# Patient Record
Sex: Female | Born: 1965 | Hispanic: Yes | Marital: Single | Smoking: Never smoker
Health system: Southern US, Community
[De-identification: ages and names within clinical notes are randomized; demographics above are authoritative.]

## PROBLEM LIST (undated history)

## (undated) DIAGNOSIS — Z9221 Personal history of antineoplastic chemotherapy: Secondary | ICD-10-CM

## (undated) DIAGNOSIS — C539 Malignant neoplasm of cervix uteri, unspecified: Secondary | ICD-10-CM

## (undated) DIAGNOSIS — Z923 Personal history of irradiation: Secondary | ICD-10-CM

## (undated) HISTORY — DX: Malignant neoplasm of cervix uteri, unspecified: C53.9

## (undated) HISTORY — DX: Personal history of irradiation: Z92.3

## (undated) HISTORY — DX: Personal history of antineoplastic chemotherapy: Z92.21

---

## 2002-03-29 ENCOUNTER — Inpatient Hospital Stay (HOSPITAL_COMMUNITY): Admission: AD | Admit: 2002-03-29 | Discharge: 2002-03-31 | Payer: Self-pay | Admitting: *Deleted

## 2009-02-11 HISTORY — PX: BILATERAL SALPINGOOPHORECTOMY: SHX1223

## 2009-07-05 ENCOUNTER — Encounter: Admission: RE | Admit: 2009-07-05 | Discharge: 2009-07-05 | Payer: Self-pay | Admitting: Specialist

## 2009-07-27 ENCOUNTER — Ambulatory Visit: Payer: Self-pay | Admitting: Obstetrics & Gynecology

## 2009-07-27 ENCOUNTER — Other Ambulatory Visit: Admission: RE | Admit: 2009-07-27 | Discharge: 2009-07-27 | Payer: Self-pay | Admitting: Obstetrics and Gynecology

## 2009-07-28 ENCOUNTER — Encounter: Payer: Self-pay | Admitting: Obstetrics and Gynecology

## 2009-07-28 LAB — CONVERTED CEMR LAB
CA 125: 11.9 units/mL (ref 0.0–30.2)
HCT: 27.8 % — ABNORMAL LOW (ref 36.0–46.0)
MCHC: 30.2 g/dL (ref 30.0–36.0)
RBC: 3.61 M/uL — ABNORMAL LOW (ref 3.87–5.11)
RDW: 16 % — ABNORMAL HIGH (ref 11.5–15.5)
WBC: 5.8 10*3/uL (ref 4.0–10.5)

## 2009-08-16 ENCOUNTER — Ambulatory Visit: Payer: Self-pay | Admitting: Obstetrics & Gynecology

## 2009-08-17 ENCOUNTER — Encounter: Payer: Self-pay | Admitting: Obstetrics and Gynecology

## 2009-08-17 ENCOUNTER — Ambulatory Visit: Admission: RE | Admit: 2009-08-17 | Discharge: 2009-08-17 | Payer: Self-pay | Admitting: Gynecologic Oncology

## 2009-08-17 LAB — CONVERTED CEMR LAB: Ferritin: 2 ng/mL — ABNORMAL LOW (ref 10–291)

## 2009-09-22 ENCOUNTER — Ambulatory Visit: Admission: RE | Admit: 2009-09-22 | Discharge: 2009-11-14 | Payer: Self-pay | Admitting: Radiation Oncology

## 2009-10-03 ENCOUNTER — Ambulatory Visit: Payer: Self-pay | Admitting: Oncology

## 2009-10-05 LAB — COMPREHENSIVE METABOLIC PANEL
Alkaline Phosphatase: 50 U/L (ref 39–117)
BUN: 21 mg/dL (ref 6–23)
Calcium: 8.5 mg/dL (ref 8.4–10.5)
Chloride: 104 mEq/L (ref 96–112)
Creatinine, Ser: 0.67 mg/dL (ref 0.40–1.20)
Glucose, Bld: 99 mg/dL (ref 70–99)
Potassium: 4.4 mEq/L (ref 3.5–5.3)

## 2009-10-05 LAB — CBC WITH DIFFERENTIAL/PLATELET
BASO%: 0.2 % (ref 0.0–2.0)
Basophils Absolute: 0 10*3/uL (ref 0.0–0.1)
Eosinophils Absolute: 0.2 10*3/uL (ref 0.0–0.5)
LYMPH%: 33.2 % (ref 14.0–49.7)
MCHC: 31.6 g/dL (ref 31.5–36.0)
MCV: 77 fL — ABNORMAL LOW (ref 79.5–101.0)
MONO%: 7.4 % (ref 0.0–14.0)
RBC: 4.31 10*6/uL (ref 3.70–5.45)
WBC: 5.9 10*3/uL (ref 3.9–10.3)

## 2009-10-23 LAB — CBC WITH DIFFERENTIAL/PLATELET
BASO%: 0.4 % (ref 0.0–2.0)
LYMPH%: 18.8 % (ref 14.0–49.7)
MCHC: 32.3 g/dL (ref 31.5–36.0)
MCV: 78.4 fL — ABNORMAL LOW (ref 79.5–101.0)
MONO#: 0.3 10*3/uL (ref 0.1–0.9)
Platelets: 192 10*3/uL (ref 145–400)
RDW: 23 % — ABNORMAL HIGH (ref 11.2–14.5)
WBC: 2.6 10*3/uL — ABNORMAL LOW (ref 3.9–10.3)
nRBC: 0 % (ref 0–0)

## 2009-10-23 LAB — BASIC METABOLIC PANEL
BUN: 7 mg/dL (ref 6–23)
CO2: 28 mEq/L (ref 19–32)
Calcium: 8.8 mg/dL (ref 8.4–10.5)
Creatinine, Ser: 0.52 mg/dL (ref 0.40–1.20)
Glucose, Bld: 99 mg/dL (ref 70–99)
Potassium: 4.1 mEq/L (ref 3.5–5.3)

## 2009-10-23 LAB — MAGNESIUM: Magnesium: 2.2 mg/dL (ref 1.5–2.5)

## 2009-11-02 ENCOUNTER — Ambulatory Visit: Payer: Self-pay | Admitting: Oncology

## 2009-11-06 LAB — BASIC METABOLIC PANEL
BUN: 10 mg/dL (ref 6–23)
Glucose, Bld: 107 mg/dL — ABNORMAL HIGH (ref 70–99)
Sodium: 135 mEq/L (ref 135–145)

## 2009-11-06 LAB — CBC WITH DIFFERENTIAL/PLATELET
BASO%: 0.4 % (ref 0.0–2.0)
EOS%: 0.7 % (ref 0.0–7.0)
Eosinophils Absolute: 0 10*3/uL (ref 0.0–0.5)
HCT: 33.2 % — ABNORMAL LOW (ref 34.8–46.6)
MCHC: 32.8 g/dL (ref 31.5–36.0)
MONO%: 12.5 % (ref 0.0–14.0)
RBC: 4.2 10*6/uL (ref 3.70–5.45)
RDW: 22.8 % — ABNORMAL HIGH (ref 11.2–14.5)
WBC: 2.7 10*3/uL — ABNORMAL LOW (ref 3.9–10.3)

## 2009-11-14 LAB — CBC WITH DIFFERENTIAL/PLATELET
BASO%: 0 % (ref 0.0–2.0)
Basophils Absolute: 0 10*3/uL (ref 0.0–0.1)
EOS%: 0.4 % (ref 0.0–7.0)
LYMPH%: 10.7 % — ABNORMAL LOW (ref 14.0–49.7)
MCHC: 34.1 g/dL (ref 31.5–36.0)
MONO%: 12.9 % (ref 0.0–14.0)
NEUT%: 76 % (ref 38.4–76.8)
RBC: 4.13 10*6/uL (ref 3.70–5.45)
RDW: 22.8 % — ABNORMAL HIGH (ref 11.2–14.5)
lymph#: 0.2 10*3/uL — ABNORMAL LOW (ref 0.9–3.3)
nRBC: 0 % (ref 0–0)

## 2009-11-15 DIAGNOSIS — Z9221 Personal history of antineoplastic chemotherapy: Secondary | ICD-10-CM

## 2009-11-15 HISTORY — DX: Personal history of antineoplastic chemotherapy: Z92.21

## 2009-11-16 ENCOUNTER — Ambulatory Visit (HOSPITAL_COMMUNITY): Admission: RE | Admit: 2009-11-16 | Discharge: 2009-11-16 | Payer: Self-pay | Admitting: Radiation Oncology

## 2009-11-21 ENCOUNTER — Ambulatory Visit: Admission: RE | Admit: 2009-11-21 | Discharge: 2009-11-21 | Payer: Self-pay | Admitting: Radiation Oncology

## 2009-11-21 LAB — CBC WITH DIFFERENTIAL/PLATELET
Basophils Absolute: 0 10*3/uL (ref 0.0–0.1)
EOS%: 1.3 % (ref 0.0–7.0)
HCT: 28.6 % — ABNORMAL LOW (ref 34.8–46.6)
LYMPH%: 25.7 % (ref 14.0–49.7)
MCH: 27 pg (ref 25.1–34.0)
MCV: 79.7 fL (ref 79.5–101.0)
MONO#: 0.2 10*3/uL (ref 0.1–0.9)
NEUT#: 0.9 10*3/uL — ABNORMAL LOW (ref 1.5–6.5)
NEUT%: 59.2 % (ref 38.4–76.8)
Platelets: 136 10*3/uL — ABNORMAL LOW (ref 145–400)

## 2009-11-22 ENCOUNTER — Ambulatory Visit: Admission: RE | Admit: 2009-11-22 | Discharge: 2009-11-22 | Payer: Self-pay | Admitting: Radiation Oncology

## 2009-11-24 ENCOUNTER — Ambulatory Visit: Admission: RE | Admit: 2009-11-24 | Discharge: 2009-11-24 | Payer: Self-pay | Admitting: Radiation Oncology

## 2009-11-24 LAB — CBC WITH DIFFERENTIAL/PLATELET
BASO%: 0 % (ref 0.0–2.0)
Basophils Absolute: 0 10*3/uL (ref 0.0–0.1)
HCT: 30.6 % — ABNORMAL LOW (ref 34.8–46.6)
LYMPH%: 34 % (ref 14.0–49.7)
MCHC: 33.3 g/dL (ref 31.5–36.0)
MCV: 81 fL (ref 79.5–101.0)
MONO%: 13.6 % (ref 0.0–14.0)
NEUT%: 51 % (ref 38.4–76.8)
Platelets: 174 10*3/uL (ref 145–400)
RDW: 23.4 % — ABNORMAL HIGH (ref 11.2–14.5)
WBC: 1.5 10*3/uL — ABNORMAL LOW (ref 3.9–10.3)

## 2009-11-28 ENCOUNTER — Ambulatory Visit: Admission: RE | Admit: 2009-11-28 | Discharge: 2009-11-28 | Payer: Self-pay | Admitting: Radiation Oncology

## 2009-11-28 LAB — CBC WITH DIFFERENTIAL/PLATELET
BASO%: 0.5 % (ref 0.0–2.0)
Basophils Absolute: 0 10*3/uL (ref 0.0–0.1)
HCT: 29.9 % — ABNORMAL LOW (ref 34.8–46.6)
MCHC: 33.4 g/dL (ref 31.5–36.0)
MCV: 81.3 fL (ref 79.5–101.0)
MONO#: 0.3 10*3/uL (ref 0.1–0.9)
MONO%: 15 % — ABNORMAL HIGH (ref 0.0–14.0)
NEUT#: 1 10*3/uL — ABNORMAL LOW (ref 1.5–6.5)
NEUT%: 53.9 % (ref 38.4–76.8)
Platelets: 249 10*3/uL (ref 145–400)
WBC: 1.9 10*3/uL — ABNORMAL LOW (ref 3.9–10.3)
lymph#: 0.6 10*3/uL — ABNORMAL LOW (ref 0.9–3.3)

## 2009-12-06 ENCOUNTER — Ambulatory Visit: Payer: Self-pay | Admitting: Oncology

## 2009-12-08 LAB — BASIC METABOLIC PANEL
Creatinine, Ser: 0.57 mg/dL (ref 0.40–1.20)
Potassium: 4.2 mEq/L (ref 3.5–5.3)
Sodium: 140 mEq/L (ref 135–145)

## 2009-12-08 LAB — CBC WITH DIFFERENTIAL/PLATELET
Basophils Absolute: 0 10*3/uL (ref 0.0–0.1)
Eosinophils Absolute: 0 10*3/uL (ref 0.0–0.5)
LYMPH%: 14.8 % (ref 14.0–49.7)
MCH: 29.6 pg (ref 25.1–34.0)
MCV: 85 fL (ref 79.5–101.0)
MONO%: 13.8 % (ref 0.0–14.0)
Platelets: 282 10*3/uL (ref 145–400)
RBC: 3.39 10*6/uL — ABNORMAL LOW (ref 3.70–5.45)

## 2009-12-14 ENCOUNTER — Inpatient Hospital Stay (HOSPITAL_COMMUNITY): Admission: RE | Admit: 2009-12-14 | Discharge: 2009-12-17 | Payer: Self-pay | Admitting: Radiation Oncology

## 2010-01-12 ENCOUNTER — Ambulatory Visit (HOSPITAL_COMMUNITY)
Admission: RE | Admit: 2010-01-12 | Discharge: 2010-01-12 | Payer: Self-pay | Source: Home / Self Care | Admitting: Oncology

## 2010-02-22 ENCOUNTER — Ambulatory Visit
Admission: RE | Admit: 2010-02-22 | Discharge: 2010-02-22 | Payer: Self-pay | Source: Home / Self Care | Attending: Radiation Oncology | Admitting: Radiation Oncology

## 2010-03-03 ENCOUNTER — Encounter: Payer: Self-pay | Admitting: Radiation Oncology

## 2010-03-04 ENCOUNTER — Encounter: Payer: Self-pay | Admitting: Obstetrics and Gynecology

## 2010-03-28 ENCOUNTER — Ambulatory Visit: Payer: Self-pay | Attending: Gynecologic Oncology | Admitting: Gynecologic Oncology

## 2010-03-28 DIAGNOSIS — Z9079 Acquired absence of other genital organ(s): Secondary | ICD-10-CM | POA: Insufficient documentation

## 2010-03-28 DIAGNOSIS — C539 Malignant neoplasm of cervix uteri, unspecified: Secondary | ICD-10-CM | POA: Insufficient documentation

## 2010-04-04 NOTE — Discharge Summary (Signed)
[name], [name] NO.:  [id number]  MEDICAL RECORD NO.:  [id number]          PATIENT TYPE:  WOC  LOCATION:  WOC                          FACILITY:  [facility]  PHYSICIAN:  [name], M.D.  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  REASON FOR ADMISSION:  Radioactive cesium-137 radiation implant.  CONDITION OF PATIENT ON DISCHARGE:  Stable.  FOLLOWUP APPOINTMENT:  Radiation Oncology in 1 month.  The patient needs to call if she should experience severe vaginal bleeding or a temperature of 101.5 or greater.  Telephone number for Radiation Oncology is [phone].  [name] is a very pleasant 45 year old female who earlier this year was diagnosed with advanced cervical cancer.  She underwent initial surgery at [facility].  Given the fact the patient had positive lymph nodes, she did not have this hysterectomy.  The patient subsequently was transferred to [facility] for definitive treatment. The patient was initially treated with external beam radiation therapy encompassing the pelvis and low periaortic area.  She completed 4500 cGy.  The patient also received radiosensitizing chemotherapy during the course of her external beam radiation treatments.  On December 14, 2009, the patient was admitted to the hospital and taken to the operating room.  On exam under anesthesia, the patient was noted to have shrinkage of her cervical mass.  There was no obvious parametrial extension.  The patient was prepped and draped in the usual sterile fashion and placed in the dorsal lithotomy position.  She had gold fiducial markers placed along the cervix for radiation planning.  The patient subsequently had a minimum curvature Fletcher-Suit applicator placed under ultrasound guidance.  She also had  fluoroscopy at the completion of the procedure in the operating room documenting good position of the Fletcher-Suit applicator prior to awakening.  The patient was subsequently transferred to the radiation oncology department for planning purposes.  She underwent a CT scan on the tomotherapy unit, which is a megavoltage CT scanner.  This allowed imaging of the Fletcher-Suit applicator as well as the patient's bladder and rectum and other structures within the pelvis.  After extensive planning, the patient was subsequently loaded with 25, 10, 15 mg sources in the tandem.  The left ovoid was loaded with a 10 mg source and the right ovoid was loaded with a 15 mg cesium radiation source.  The start time for the patient's implant was 9:20 p.m. on Thursday, November 3rd.  Implant duration was 63.2 hours.  Total dose to point A was 8100 cGy in summing the patient's implant dose and external beam radiation dose.  On the morning of November 6, the patient subsequently had removal of her radiation sources at 11:32 a.m.  Please note that the daylight savings time change change made adjustment in the actual time removal.  Originally, the implant was scheduled for removal at 12:32 p.m.  The patient's radiation sources were subsequently removed without difficulties.  A radiation survey by Susie Cassette, CMD, showed no retained sources  in the patient's bed or room.  The patient's Fletcher-Suit applicator was subsequently removed without difficulty.  There was minimal bleeding noted.  The patient was subsequently discharged to home and will follow up Radiation Oncology in approximately a month.  This does complete the patient's planned course of definitive treatments.          ______________________________ [name], M.D.     JDK/MEDQ  D:  12/17/2009  T:  12/17/2009  Job:  [id number]  cc:   [name], MD [address] [place] [address]  Artelia Laroche. Karren Burly, MD  Electronically Signed  by Antony Blackbird M.D. on 04/04/2010 46:96:29 PM

## 2010-04-04 NOTE — Op Note (Signed)
[name], [name]           ACCOUNT NO.:  [id number]  MEDICAL RECORD NO.:  [id number]           PATIENT TYPE:  LOCATION:                                 FACILITY:  PHYSICIAN:  [name], M.D.  DATE OF BIRTH:  14-Sep-1965  DATE OF PROCEDURE: DATE OF DISCHARGE:                              OPERATIVE REPORT   PREOPERATIVE DIAGNOSIS:  Locally advanced squamous cell carcinoma of cervix.  POSTOPERATIVE DIAGNOSIS:  Locally advanced squamous cell carcinoma of cervix.  PROCEDURES: 1. Exam under anesthesia. 2. Placement of a gold fiducial markers in preparation for radiation     planning and treatment. 3. Placement of a Fletcher-Suit applicator in preparation for the     cesium-137 radioactive implant.  SURGEON:  [name], MD.  ASSISTANT:  None.  ANESTHESIA:  General LMA.  SPECIMENS:  None.  ESTIMATED BLOOD LOSS:  Zero.  COMPLICATIONS:  None.  FINDINGS:  The patient's uterus was mildly inverted and sounded to approximately 8-9 cm.  The cervix was estimated to be approximately 3-4 cm in size without obvious parametrial extension on exam under anesthesia.  Diane King is a 45 year old female who was diagnosed earlier this year with advanced cervical cancer.  She was taken to the operating room at [facility] and was scheduled for radical hysterectomy.  At the time of the patient's exploratory laparotomy, she was found to have positive nodes.  In light of this, the patient's radical hysterectomy was aborted.  The patient was subsequently transferred to [facility] for the remainder of her care.  The patient previously underwent external beam radiation therapy, which she completed approximately 2 weeks ago.  The patient is now taken to the operating room for her intracavitary brachytherapy.  NARRATIVE:  The patient was prepped and draped in the usual sterile fashion and placed in the dorsal lithotomy position.  Under exam under anesthesia, the patient  was noted to have a cervical mass, which was estimated to be approximately 3-4 cm in size.  There was no obvious parametrial extension.  The patient then had placement of a Foley catheter under sterile conditions.  The Foley balloon was placed with approximately 50% contrast for imaging purposes.  The patient then proceeded to have 3 fiducial markers placed within the cervix.  These were placed approximately the 10 o'clock, 2 o'clock, and 6 o'clock position of the cervix.  The patient then proceeded to undergo sounding of the uterus, followed by dilatation.  The uterus was estimated to be approximately 8-9 cm in length and was mildly anteverted.  The patient was dilated to sounding of 24.  The patient then had placement of a moderate curvature tandem within the uterus.  This was performed with intraoperative ultrasound guidance showing good placement of the tandem near the fundus and well positioned centrally within the uterus.  The patient then had 2 ovoids placed within the proximal vagina.  Minimal shielding caps were placed over the ovoids.  The patient then had the vaginal vault packed with sterile gauze soaked in the metronidazole vaginal cream.  Special care was taken to move the rectum and bladder as much out of the high-dose  radiation field as possible.  After completion of packing, the patient then had placement of a suture in the labia majora to help prevent slippage of the Fletcher-Suit applicator.  The patient tolerated the procedure well.  The patient then had fluoroscopy of her cesium implant intraoperatively in the supine position.  AP and lateral films were obtained, which would be used in implanting her cesium implant along with megavoltage CT scanning on the Tomotherapy unit.  The patient was subsequently transported to the recovery room in stable condition.  After appropriate recuperation, the patient will be taken to radiation-oncology department for the planning.  She  will eventually have loading of her cesium-137 radiation sources this evening with anticipated implant of approximately 60 hours.          ______________________________ [name], M.D.     JDK/MEDQ  D:  12/14/2009  T:  12/15/2009  Job:  [id number]  cc:   [name], MD [address] [place] [address]  Lennis P. Darrold Span, M.D. Fax: [phone]  Electronically Signed by Antony Blackbird M.D. on 04/04/2010 04:39:49 PM

## 2010-04-25 LAB — DIFFERENTIAL
Basophils Absolute: 0 10*3/uL (ref 0.0–0.1)
Eosinophils Absolute: 0 10*3/uL (ref 0.0–0.7)
Monocytes Absolute: 0.3 10*3/uL (ref 0.1–1.0)
Monocytes Relative: 11 % (ref 3–12)
Neutro Abs: 1.8 10*3/uL (ref 1.7–7.7)

## 2010-04-25 LAB — BASIC METABOLIC PANEL
BUN: 9 mg/dL (ref 6–23)
Calcium: 9 mg/dL (ref 8.4–10.5)
GFR calc Af Amer: >60 mL/min (ref >60)
Glucose, Bld: 113 mg/dL — ABNORMAL HIGH (ref 70–99)
Sodium: 140 mEq/L (ref 135–145)

## 2010-04-25 LAB — URINALYSIS, ROUTINE W REFLEX MICROSCOPIC
Glucose, UA: NEGATIVE mg/dL
Hgb urine dipstick: NEGATIVE
Ketones, ur: NEGATIVE mg/dL
Nitrite: NEGATIVE
Specific Gravity, Urine: 1.022 (ref 1.005–1.030)
Urobilinogen, UA: 0.2 mg/dL (ref 0.0–1.0)
pH: 6.5 (ref 5.0–8.0)

## 2010-04-25 LAB — CBC
Hemoglobin: 10.4 g/dL — ABNORMAL LOW (ref 12.0–15.0)
MCV: 85 fL (ref 78.0–100.0)
RDW: 25.9 % — ABNORMAL HIGH (ref 11.5–15.5)
WBC: 2.4 10*3/uL — ABNORMAL LOW (ref 4.0–10.5)

## 2010-04-25 LAB — URINE MICROSCOPIC-ADD ON

## 2010-04-25 LAB — URINE CULTURE: Colony Count: NO GROWTH

## 2010-04-25 LAB — SURGICAL PCR SCREEN: Staphylococcus aureus: NEGATIVE

## 2010-04-26 LAB — DIFFERENTIAL
Basophils Absolute: 0 10*3/uL (ref 0.0–0.1)
Basophils Relative: 0 % (ref 0–1)
Eosinophils Relative: 1 % (ref 0–5)
Monocytes Relative: 8 % (ref 3–12)
Neutro Abs: 2.1 10*3/uL (ref 1.7–7.7)

## 2010-04-26 LAB — URINE CULTURE
Culture  Setup Time: 201110061723
Culture: NO GROWTH
Special Requests: NEGATIVE

## 2010-04-26 LAB — URINALYSIS, ROUTINE W REFLEX MICROSCOPIC
Bilirubin Urine: NEGATIVE
Hgb urine dipstick: NEGATIVE
Ketones, ur: NEGATIVE mg/dL
Nitrite: NEGATIVE
Specific Gravity, Urine: 1.024 (ref 1.005–1.030)
Urobilinogen, UA: 0.2 mg/dL (ref 0.0–1.0)
pH: 6.5 (ref 5.0–8.0)

## 2010-04-26 LAB — COMPREHENSIVE METABOLIC PANEL
AST: 15 U/L (ref 0–37)
Albumin: 3.8 g/dL (ref 3.5–5.2)
CO2: 27 mEq/L (ref 19–32)
Chloride: 106 mEq/L (ref 96–112)
Creatinine, Ser: 0.48 mg/dL (ref 0.4–1.2)
Glucose, Bld: 100 mg/dL — ABNORMAL HIGH (ref 70–99)
Potassium: 3.7 mEq/L (ref 3.5–5.1)
Sodium: 139 mEq/L (ref 135–145)

## 2010-04-26 LAB — CBC
HCT: 32.5 % — ABNORMAL LOW (ref 36.0–46.0)
Platelets: 173 10*3/uL (ref 150–400)

## 2010-04-26 LAB — PREGNANCY, URINE: Preg Test, Ur: NEGATIVE

## 2010-04-26 LAB — PROTIME-INR
INR: 0.93 (ref 0.00–1.49)
Prothrombin Time: 12.7 seconds (ref 11.6–15.2)

## 2010-05-04 NOTE — Consult Note (Signed)
  [name], [name] NO.:  [id number]  MEDICAL RECORD NO.:  [id number]           PATIENT TYPE:  LOCATION:                                 FACILITY:  PHYSICIAN:  [name], MD     DATE OF BIRTH:  September 03, 1965  DATE OF CONSULTATION:  03/28/2010 DATE OF DISCHARGE:                                CONSULTATION   HISTORY OF PRESENT ILLNESS:  This is a 45 year old who presented with a stage IB squamous cell carcinoma of the cervix.  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  Bilateral ovarian transposition was performed as well as bilateral pelvic lymph node dissection.  Pathology was notable for presence of metastatic disease within a pelvic lymph node. She subsequently underwent definitive radiotherapy with concurrent chemotherapy.  Treatment was completed in November 2011. Postoperatively during radiation, abnormal uterine bleeding was noted, however, this has since stopped.  PAST MEDICAL HISTORY:  Denies.  PAST SURGICAL HISTORY:  Right salpingo-oophorectomy, left ovarian transposition, bilateral pelvic lymph node dissection in 2011.  SOCIAL HISTORY:  Denies tobacco or alcohol use.  She is supported by her children and is of [place] origin.  REVIEW OF SYSTEMS:  10-point review of systems, weight gain.  No nausea, no vomiting.  No abdominal pain.  No lower extremity edema.  PHYSICAL EXAMINATION:  GENERAL:  Well-developed female in no acute distress. ABDOMEN:  Soft, nontender.  Midline incision well healed without any hernia. BACK:  No CVA tenderness. LYMPH NODES:  No cervical, subclavicular, or inguinal adenopathy. PELVIC:  Normal external genitalia, Bartholin, urethral, and Skene.  No evidence of residual disease within the cervix.  No parametrial nodularity.  Normal uterine mobility. RECTAL:  Good anal sphincter  tone without any masses.  IMPRESSION:  [name] without evidence of disease following definitive treatment of her stage IB cervical cancer with 1 positive pelvic node.  She will follow up with Dr. Roselind Messier in 3 months and follow up with GYN/Oncology in 6 months.     [name], MD     WB/MEDQ  D:  03/28/2010  T:  03/29/2010  Job:  [id number]  cc:   Allie Bossier, MD  [name], M.D. Fax: [phone]  Telford Nab, RN  Electronically Signed by [name] MD on 03/29/2010 01:13:37 PM

## 2010-06-21 ENCOUNTER — Other Ambulatory Visit: Payer: Self-pay | Admitting: Oncology

## 2010-06-21 ENCOUNTER — Encounter (HOSPITAL_BASED_OUTPATIENT_CLINIC_OR_DEPARTMENT_OTHER): Payer: Self-pay | Admitting: Oncology

## 2010-06-21 DIAGNOSIS — Z5111 Encounter for antineoplastic chemotherapy: Secondary | ICD-10-CM

## 2010-06-21 DIAGNOSIS — D649 Anemia, unspecified: Secondary | ICD-10-CM

## 2010-06-21 DIAGNOSIS — C539 Malignant neoplasm of cervix uteri, unspecified: Secondary | ICD-10-CM

## 2010-06-21 LAB — COMPREHENSIVE METABOLIC PANEL
ALT: 15 U/L (ref 0–35)
AST: 15 U/L (ref 0–37)
Albumin: 4.3 g/dL (ref 3.5–5.2)
Alkaline Phosphatase: 68 U/L (ref 39–117)
Chloride: 104 mEq/L (ref 96–112)
Potassium: 3.8 mEq/L (ref 3.5–5.3)
Sodium: 139 mEq/L (ref 135–145)
Total Protein: 6.8 g/dL (ref 6.0–8.3)

## 2010-06-21 LAB — CBC WITH DIFFERENTIAL/PLATELET
BASO%: 0.2 % (ref 0.0–2.0)
EOS%: 1 % (ref 0.0–7.0)
MCH: 29.5 pg (ref 25.1–34.0)
MCV: 88.6 fL (ref 79.5–101.0)
MONO%: 7.8 % (ref 0.0–14.0)
RBC: 3.89 10*6/uL (ref 3.70–5.45)
RDW: 15.8 % — ABNORMAL HIGH (ref 11.2–14.5)
lymph#: 0.6 10*3/uL — ABNORMAL LOW (ref 0.9–3.3)

## 2010-06-26 ENCOUNTER — Ambulatory Visit
Admission: RE | Admit: 2010-06-26 | Discharge: 2010-06-26 | Disposition: A | Discharge status: Home / Self Care | Payer: Self-pay | Source: Ambulatory Visit | Attending: Radiation Oncology | Admitting: Radiation Oncology

## 2010-06-26 ENCOUNTER — Other Ambulatory Visit (HOSPITAL_COMMUNITY)
Admission: RE | Admit: 2010-06-26 | Discharge: 2010-06-26 | Disposition: A | Discharge status: Home / Self Care | Payer: Self-pay | Source: Ambulatory Visit | Attending: Radiation Oncology | Admitting: Radiation Oncology

## 2010-06-26 ENCOUNTER — Other Ambulatory Visit: Payer: Self-pay | Admitting: Radiation Oncology

## 2010-06-26 DIAGNOSIS — C539 Malignant neoplasm of cervix uteri, unspecified: Secondary | ICD-10-CM | POA: Insufficient documentation

## 2010-09-27 ENCOUNTER — Ambulatory Visit: Payer: Self-pay | Attending: Gynecologic Oncology | Admitting: Gynecologic Oncology

## 2010-09-27 DIAGNOSIS — C539 Malignant neoplasm of cervix uteri, unspecified: Secondary | ICD-10-CM | POA: Insufficient documentation

## 2010-09-27 DIAGNOSIS — C775 Secondary and unspecified malignant neoplasm of intrapelvic lymph nodes: Secondary | ICD-10-CM | POA: Insufficient documentation

## 2010-09-27 DIAGNOSIS — Z9079 Acquired absence of other genital organ(s): Secondary | ICD-10-CM | POA: Insufficient documentation

## 2010-10-02 NOTE — Consult Note (Signed)
  NAMEDASHANA, Diane King NO.:  [id number]  MEDICAL RECORD NO.:  [id number]  LOCATION:  GYN                          FACILITY:  [facility]  PHYSICIAN:  [name], MD     DATE OF BIRTH:  Mar 31, 1965  DATE OF CONSULTATION:  09/27/2010 DATE OF DISCHARGE:                                CONSULTATION   REASON FOR VISIT:  Surveillance for stage IB cervical cancer.  HISTORY OF PRESENT ILLNESS:  This is a 45 year old who presented with evidence of a stage IB cervical carcinoma.  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 Metastatic disease was identified within the pelvic node as such she underwent chemoradiation therapy which was completed in October 2011.  PAST MEDICAL HISTORY:  No interval changes.  PAST SURGICAL HISTORY:  Bilateral salpingo-oophorectomy and bilateral pelvic lymph node dissection in 2011.  SOCIAL HISTORY:  Denies tobacco or IV drug abuse.  She is not employed. She migrated from [place] numerous years ago.  She reports that she is intermittently sexually active.  REVIEW OF SYSTEMS:  No nausea, vomiting, fever, chills, headache, shortness of breath, or hemoptysis.  No weight loss, early satiety, or changes in appetite.  No diarrhea, constipation, bleeding from the rectum, bladder, or vagina.  PHYSICAL EXAMINATION:  GENERAL:  A well-developed female, in no acute distress. VITAL SIGNS:  Weight 163 pounds, blood pressure 120/64, and pulse 72. CHEST:  Clear to auscultation. LYMPH NODES:  No cervical, supraclavicular, or inguinal adenopathy. HEART:  Regular rate and rhythm. BACK:  No CVA tenderness. ABDOMEN:  Soft and nontender. PELVIC:  Normal external genitalia, Bartholin's, urethral, and Skene's. Vagina is atrophic.  Cervix is without any lesion.  No nodularity noted in the parametrial areas.  Uterus is mobile.  No nodularity in  the cul- de-sac. RECTAL:  Good anal sphincter tone without any rectovaginal septal nodularity. EXTREMITIES:  No clubbing, cyanosis, or edema.  IMPRESSION:  Diane King is NED for 10 months since completion of definitive chemoradiation.  Pap test collected in May 2012 by Dr. Roselind Messier is without any abnormalities.  I have asked her to follow up with Dr. Roselind Messier  in November 2012 and follow up with GYN/Oncology in February 2013.     [name], MD     WB/MEDQ  D:  09/27/2010  T:  09/28/2010  Job:  [id number]  cc:   Reece Packer, M.D. Fax: [phone]  [name], Ph.D., M.D. Fax: [phone]  Telford Nab, R.N. [address] [place], [place] [address]  Electronically Signed by [name] MD on 10/02/2010 07:09:34 AM

## 2010-12-21 ENCOUNTER — Other Ambulatory Visit: Payer: Self-pay | Admitting: Lab

## 2010-12-21 ENCOUNTER — Ambulatory Visit: Payer: Self-pay | Admitting: Oncology

## 2010-12-23 ENCOUNTER — Telehealth: Payer: Self-pay | Admitting: Oncology

## 2010-12-23 NOTE — Telephone Encounter (Signed)
FTKA visit Dr.Livesay Nov.9, 2012. Per Dr.Brewster's consult note of Sep 27, 2010, follow up was to be with Dr.Kinard in Nov, 2012 and back to Dr. Nelly Rout in Feb. 2013. I will not try to reschedule my appt, but will be glad to see her back at any time if the other physicians request.

## 2010-12-31 ENCOUNTER — Ambulatory Visit: Payer: Self-pay | Admitting: Radiation Oncology

## 2011-01-01 ENCOUNTER — Encounter: Payer: Self-pay | Admitting: *Deleted

## 2011-01-01 ENCOUNTER — Telehealth: Payer: Self-pay | Admitting: Oncology

## 2011-01-01 ENCOUNTER — Other Ambulatory Visit (HOSPITAL_COMMUNITY)
Admission: RE | Admit: 2011-01-01 | Discharge: 2011-01-01 | Disposition: A | Discharge status: Home / Self Care | Payer: Self-pay | Source: Ambulatory Visit | Attending: Radiation Oncology | Admitting: Radiation Oncology

## 2011-01-01 ENCOUNTER — Ambulatory Visit
Admission: RE | Admit: 2011-01-01 | Discharge: 2011-01-01 | Disposition: A | Discharge status: Home / Self Care | Payer: Self-pay | Source: Ambulatory Visit | Attending: Radiation Oncology | Admitting: Radiation Oncology

## 2011-01-01 VITALS — BP 114/72 | HR 93 | Temp 97.1°F | Resp 20 | Wt 155.7 lb

## 2011-01-01 DIAGNOSIS — C539 Malignant neoplasm of cervix uteri, unspecified: Secondary | ICD-10-CM

## 2011-01-01 DIAGNOSIS — Z923 Personal history of irradiation: Secondary | ICD-10-CM

## 2011-01-01 DIAGNOSIS — R87810 Cervical high risk human papillomavirus (HPV) DNA test positive: Secondary | ICD-10-CM | POA: Insufficient documentation

## 2011-01-01 DIAGNOSIS — Z01419 Encounter for gynecological examination (general) (routine) without abnormal findings: Secondary | ICD-10-CM | POA: Insufficient documentation

## 2011-01-01 NOTE — Progress Notes (Signed)
Follow up cervical ca, radiation therapy external beam 10/10/09-11/14/09 and cesium -137 intracavity tx 12/14/09-12/17/09   NKDA  WIDOWED

## 2011-01-01 NOTE — Progress Notes (Signed)
Encounter addended by: [name], MD on: 01/01/2011  4:18 PM<BR>     Documentation filed: Orders

## 2011-01-01 NOTE — Progress Notes (Signed)
Pap smear collected 1330pmn,pt tolerated well, will send specimen to the lab, 6 month f/u  appt given to interpreter dorita arias to show pt where to get appt made,  1:42 PM

## 2011-01-01 NOTE — Progress Notes (Signed)
Pt here for f/u pap to be done today, interpreter Darletta Moll with pt, pt is spanish speaking, no c/opain or discomfort,no bleeding, doesn't take any medications stated .explained that pap smear will be collected by Dr. Roselind Messier and results should be back within 1 week,pt will be called with that,interpreter Darletta Moll  States to call the interpreter service to explain results, ok by pt to do this  Pt had radiation therapy cervical cancer 10/10/09-11/14/09, cesium intracavity  tx 12/14/09-12/17/09 1:19 PM

## 2011-01-01 NOTE — Telephone Encounter (Signed)
Patient FTKA visit with Dr.Livesay on Dec 21, 2010. Per Dr.Brewster's consult note of Aug 2012, f/u is planned with Dr.Kinard in ~ Nov and Dr.Brewster in Feb. With close follow up by other MDs, I will not try to reschedule the FTKA visit here. This information sent to gyn onc and Dr.Kinard via email now. Certainly I am glad to see her back at any time if needed.

## 2011-01-02 NOTE — Progress Notes (Signed)
CC:   [name], MD [name], MD [name], MD  DIAGNOSIS:  Locally advanced cervical cancer.  INTERVAL SINCE RADIATION THERAPY:  One year.  NARRATIVE:  Diane King comes in today for routine followup.  She completed external beam and intracavitary cesium 137 treatment approximately a year ago.  Clinically the patient seems to be doing well.  She has been inconsistent with using her vaginal dilator, and I have recommended the importance of this issue.  The patient is accompanied by an interpreter today.  Through the interpreter, the patient denies any pain in the pelvis area, vaginal bleeding, blood in her urine, or rectal bleeding.  The patient denies any abdominal pain or nausea.  The patient's appetite is good.  PHYSICAL EXAMINATION:  Vital Signs:  The patient's weight is 155. Temperature is 97.1, pulse 93, blood pressure 114/72.  Neck: Examination of the neck and supraclavicular region reveals no evidence of adenopathy.  The axillary areas are free of adenopathy. Lungs: Examination of the lungs reveals them to be clear.  Heart:  The heart has a regular rhythm and rate.  Abdomen:  Soft and nontender with normal bowel sounds.  There is no inguinal adenopathy appreciated.  Pelvic:  On pelvic examination, the external genitalia are unremarkable.  A speculum exam was performed.  There are no mucosal lesions noted.  A Pap smear is obtained at what appears to be the cervical os.  On bimanual and rectovaginal examination there are no pelvic masses appreciated.  IMPRESSION AND PLAN:  Clinically NED, Pap smear pending.  The patient will return for routine followup in 6 months in the interim will see Dr. Nelly Rout.    ______________________________ [name], Ph.D., M.D. JDK/MEDQ  D:  01/01/2011  T:  01/01/2011  Job:  [id number]

## 2011-01-07 ENCOUNTER — Telehealth: Payer: Self-pay | Admitting: Oncology

## 2011-01-07 NOTE — Telephone Encounter (Signed)
Approved 100% ind 01/07/11 - 07/07/10. Family 1, no income, received letter of support.

## 2011-01-17 ENCOUNTER — Other Ambulatory Visit: Payer: Self-pay | Admitting: Gynecologic Oncology

## 2011-01-17 DIAGNOSIS — Z1231 Encounter for screening mammogram for malignant neoplasm of breast: Secondary | ICD-10-CM

## 2011-01-22 ENCOUNTER — Telehealth: Payer: Self-pay | Admitting: *Deleted

## 2011-01-22 NOTE — Telephone Encounter (Signed)
Called Diane King,interpreter phone [phone], asking for call back need of interpreter 2:31 PM to call results of pt's pap smear,awaiting call back

## 2011-01-24 ENCOUNTER — Telehealth: Payer: Self-pay | Admitting: *Deleted

## 2011-01-24 NOTE — Telephone Encounter (Signed)
Called pt home, unable to leave voice message,haven't heard back from interpreter will try pts cell phone and recall interperter for help to leave results of pts pap 11:25 AM

## 2011-01-24 NOTE — Telephone Encounter (Signed)
[name] SOWELL,INTERPRETER CALLED AND EXPLAINED i HAD TRIED TO CALL PT HOME PHONE AND CELL PHONE, UNABLE TO GET MESSAGE, [name], STATED:I KNOW HOW TO GET A HOLD OF HER, WILL INFOM HER OB ABNORMAL CELLS AND LAB TO DO MORE TESTING AND WILL GET BACK IN TOUCH WITH HER,[name] WILL CALL us BACK AFTER SHE SPEAKS WITH THE PATIENT 2:56 PM

## 2011-01-24 NOTE — Telephone Encounter (Signed)
Called pt cell phone,unable to leave message either 11:26 AM

## 2011-01-24 NOTE — Telephone Encounter (Signed)
Diane King,interpreter called and gave information results to pt (,[phone]=[name]'s number), she will let pt know if anything comes up different from further testing and  For [place] to get in touch with [name],herself on her phone, thanked Diane Fanning for calling back so soon, will call her if anything different comes up , will let Dr,.Kinard know as well 3:39 PM

## 2011-02-22 ENCOUNTER — Ambulatory Visit (HOSPITAL_COMMUNITY)
Admission: RE | Admit: 2011-02-22 | Discharge: 2011-02-22 | Disposition: A | Discharge status: Home / Self Care | Payer: Self-pay | Source: Ambulatory Visit | Attending: Gynecologic Oncology | Admitting: Gynecologic Oncology

## 2011-02-22 DIAGNOSIS — Z1231 Encounter for screening mammogram for malignant neoplasm of breast: Secondary | ICD-10-CM | POA: Insufficient documentation

## 2011-04-04 ENCOUNTER — Ambulatory Visit: Payer: Self-pay | Admitting: Gynecologic Oncology

## 2011-04-10 ENCOUNTER — Encounter: Payer: Self-pay | Admitting: *Deleted

## 2011-04-11 ENCOUNTER — Encounter: Payer: Self-pay | Admitting: Gynecologic Oncology

## 2011-04-11 ENCOUNTER — Ambulatory Visit: Payer: Self-pay | Attending: Gynecologic Oncology | Admitting: Gynecologic Oncology

## 2011-04-11 VITALS — BP 102/68 | HR 68 | Temp 97.5°F | Resp 16 | Ht 63.07 in | Wt 156.4 lb

## 2011-04-11 DIAGNOSIS — Z923 Personal history of irradiation: Secondary | ICD-10-CM | POA: Insufficient documentation

## 2011-04-11 DIAGNOSIS — Z9221 Personal history of antineoplastic chemotherapy: Secondary | ICD-10-CM | POA: Insufficient documentation

## 2011-04-11 DIAGNOSIS — C539 Malignant neoplasm of cervix uteri, unspecified: Secondary | ICD-10-CM | POA: Insufficient documentation

## 2011-04-11 DIAGNOSIS — Z09 Encounter for follow-up examination after completed treatment for conditions other than malignant neoplasm: Secondary | ICD-10-CM | POA: Insufficient documentation

## 2011-04-11 DIAGNOSIS — Z8541 Personal history of malignant neoplasm of cervix uteri: Secondary | ICD-10-CM | POA: Insufficient documentation

## 2011-04-11 NOTE — Patient Instructions (Signed)
Followup with radiation oncology in May 2013 with GYN oncology in August of 2013

## 2011-04-11 NOTE — Progress Notes (Signed)
Follow-up Note: Gyn-Onc   Diane King 46 y.o. female  CC:  Chief Complaint  Patient presents with  . Cervical Cancer    Follow up    JXB:JYNWGNFAOZHY for stage IB cervical cancer.  HISTORY OF PRESENT ILLNESS: This is a 46 year old who presented with  evidence of a stage IB cervical carcinoma. She underwent an exploratory  laparotomy, but the plan for radical hysterectomy was aborted because  metastatic disease was identified in the right cardinal ligament.  Metastatic disease was identified within the pelvic node as such she  underwent chemoradiation therapy which was completed in October 2011.   Interval History: The patient was seen by Dr. Roselind Messier in November of 2012. A Pap test at that time returned with atypical squamous cells of undetermined significance. The features favor a reparative process. Denies weight loss, adenopathy, nausea, vomiting vaginal or rectal bleeding. She is minimally compliant with the use of a vaginal dilator and reports infrequent sexual intercourse  Performance status: 0  Current Meds:  No outpatient encounter prescriptions on file as of 04/11/2011.    Allergy: No Known Allergies  Social Hx:   History   Social History  . Marital Status: Single    Spouse Name: N/A    Number of Children: N/A  . Years of Education: N/A   Occupational History  .      widowed   Social History Main Topics  . Smoking status: Never Smoker   . Smokeless tobacco: Not on file  . Alcohol Use: No  . Drug Use: No  . Sexually Active: Not on file   Other Topics Concern  . Not on file   Social History Narrative  . No narrative on file    Past Surgical Hx:  Past Surgical History  Procedure Date  . Bilateral salpingoophorectomy 2011    Bilateral pelvic lymph node dissection    Past Medical Hx:  Past Medical History  Diagnosis Date  . Cervical cancer 07-27-2009 CLINICAL IB1    POORLY DIFFERENTIATED SQUAMOUS CELL CARCINOMA  . Anemia     MILD MICROCYTIC  ANEMIA  . History of chemotherapy 11/15/09    Cisplatin 4 cycles weekly completed 11/15/09  . Hx of radiation therapy 10/10/09-11/14/09    cervical   . Hx of radiation therapy 12/14/09-12/17/09    cesium implant intracavity    Family Hx: No family history on file.  Review of Systems:  Constitutional  Feels well  Skin No rash, sores, jaundice, itching, dryness,  Cardiovascular  No chest pain, shortness of breath, or edema  Pulmonary  No cough or wheeze.  Gastro Intestinal  No nausea, vomitting, or diarrhoea. No bright red blood per rectum, no abdominal pain, change in bowel movement, or constipation.  Genito Urinary  No frequency, urgency, dysuria, no bleeding or discharge. Minimally compliant with the use of vaginal dilator. Musculo Skeletal  No myalgia, arthralgia, joint swelling or pain  Neurologic  No weakness, numbness, change in gait,  Psychology  No depression, anxiety, insomnia.     Vitals:  Blood pressure 102/68, pulse 68, temperature 97.5 F (36.4 C), resp. rate 16, height 5' 3.07" (1.602 m), weight 156 lb 6.4 oz (70.943 kg).  Physical Exam: WD female in NAD Neck  Supple without any enlargements.  Lymph node survey. No cervical supraclavicular cervical or inguinal adenopathy Cardiovascular  Pulse normal rate, regularity and rhythm. S1 and S2 normal. Lungs  Clear to auscultation bilateraly, without wheezes/crackles/rhonchi. Good air movement.  Skin  No rash/lesions/breakdown  Psychiatry  Alert  and oriented to person, place, and time  Back No CVA tenderness Abdomen  Normoactive bowel sounds, abdomen soft, non-tender and obese. Surgical  sites intact without evidence of hernia.  Genito Urinary  Vulva/vagina: Normal external female genitalia.  No lesions.   Bladder/urethra:  No lesions or masses  Cervix The cervix is flush with the vaginal vault The vagina is atrophic Rectal  Good tone, no masses no cul de sac nodularity.  Extremities  No bilateral  cyanosis, clubbing or edema.    Assessment/Plan:  This is a 46 y.o. year old stage IB cervical carcinoma treated with definitive chemoradiation.  Without any evidence of disease. She's been advised to followup with radiation oncology in May 2013 with GYN oncology in August of 2013   [name], MD., PhD. 04/11/2011, 3:37 PM

## 2011-06-17 ENCOUNTER — Ambulatory Visit
Admission: RE | Admit: 2011-06-17 | Discharge: 2011-06-17 | Disposition: A | Discharge status: Home / Self Care | Payer: Self-pay | Source: Ambulatory Visit | Attending: Radiation Oncology | Admitting: Radiation Oncology

## 2011-06-17 ENCOUNTER — Encounter: Payer: Self-pay | Admitting: Radiation Oncology

## 2011-06-17 ENCOUNTER — Other Ambulatory Visit (HOSPITAL_COMMUNITY)
Admission: RE | Admit: 2011-06-17 | Discharge: 2011-06-17 | Disposition: A | Discharge status: Home / Self Care | Payer: Self-pay | Source: Ambulatory Visit | Attending: Radiation Oncology | Admitting: Radiation Oncology

## 2011-06-17 VITALS — BP 105/72 | HR 81 | Wt 162.0 lb

## 2011-06-17 DIAGNOSIS — C539 Malignant neoplasm of cervix uteri, unspecified: Secondary | ICD-10-CM

## 2011-06-17 DIAGNOSIS — Z01419 Encounter for gynecological examination (general) (routine) without abnormal findings: Secondary | ICD-10-CM | POA: Insufficient documentation

## 2011-06-17 NOTE — Progress Notes (Signed)
  Radiation Oncology         [phone] ________________________________  Name: Diane King MRN: [id number]  Date: 06/17/2011  DOB: 1965/09/23  Follow-Up Visit Note  CC: No primary provider on file.  Felix Ahmadi, MD  Diagnosis:   Cervical cancer  Interval Since Last Radiation:  18 months  Narrative:  The patient returns today for routine follow-up.  The patient denies any vaginal bleeding hematuria or rectal bleeding. Patient denies any pain in the pelvis area. The exam is with the assistance of an interpreter. The patient denies any change in stool caliber.                              ALLERGIES:   has no known allergies.  Meds: No current outpatient prescriptions on file.    Physical Findings: The patient is in no acute distress. Patient is alert and oriented.  weight is 162 lb (73.483 kg). Her blood pressure is 105/72 and her pulse is 81. Marland Kitchen  No palpable supraclavicular cervical or axillary adenopathy. The lungs are clear to auscultation. The heart has regular rhythm and rate. The abdomen is soft and non-tender with normal bowel sounds. The inguinal areas are free of adenopathy. On pelvic examination the external genitalia are unremarkable. A speculum exam is performed today which shows the cervical os.   A Pap smear was obtained of the cervical area. On bimanual examination there are no obvious pelvic masses. Given the significant significant sphincter tone a rectal exam could not be performed along with the vaginal exam.  Lab Findings: Lab Results  Component Value Date   WBC 3.0* 06/21/2010   HGB 11.5* 06/21/2010   HCT 34.5* 06/21/2010   MCV 88.6 06/21/2010   PLT 199 06/21/2010    @LASTCHEM @  Radiographic Findings: No results found.  Impression:  The patient is recovering from the effects of radiation.  The patient is clinically NED.  Plan:  Followup in 6 months. In the interim the patient will see Dr.  Nelly Rout.  _____________________________________  -----------------------------------  [name], PhD, MD

## 2011-06-17 NOTE — Progress Notes (Signed)
Assisted with Pap smear with Dr. Roselind Messier. Interpreter present.  Informed patient that she may experience mild spotting because of scrapping that occurred durin PAP smear and she stated understanding via the Spanish interpreter.  Denied any prior vaginal bleeding, pain on urination nor pain on defecation

## 2011-06-24 ENCOUNTER — Ambulatory Visit: Payer: Self-pay | Admitting: Radiation Oncology

## 2011-06-26 ENCOUNTER — Ambulatory Visit: Payer: Self-pay | Attending: Gynecologic Oncology | Admitting: Gynecologic Oncology

## 2011-06-26 ENCOUNTER — Encounter: Payer: Self-pay | Admitting: Gynecologic Oncology

## 2011-06-26 VITALS — BP 118/76 | HR 64 | Temp 97.9°F | Resp 18 | Ht 63.07 in | Wt 161.0 lb

## 2011-06-26 DIAGNOSIS — C539 Malignant neoplasm of cervix uteri, unspecified: Secondary | ICD-10-CM | POA: Insufficient documentation

## 2011-06-26 DIAGNOSIS — Z923 Personal history of irradiation: Secondary | ICD-10-CM | POA: Insufficient documentation

## 2011-06-26 DIAGNOSIS — R87619 Unspecified abnormal cytological findings in specimens from cervix uteri: Secondary | ICD-10-CM | POA: Insufficient documentation

## 2011-06-26 DIAGNOSIS — Z9221 Personal history of antineoplastic chemotherapy: Secondary | ICD-10-CM | POA: Insufficient documentation

## 2011-06-26 NOTE — Patient Instructions (Signed)
Follow-up pending results.

## 2011-06-26 NOTE — Progress Notes (Signed)
Consult Note: Gyn-Onc  Diane King 46 y.o. female  CC:  Chief Complaint  Patient presents with  . 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 At that time she also had a 20 cm pelvic mass. Pathology revealed a large cystadenoma of the adnexa. Her radical hysterectomy was aborted secondary to positive parametrial involvement and lymph node involvement. When 1/1 left left para-aortic; 1/1 left iliac 0/16 right pelvic and 1/8 left pelvic lymph nodes were positive for cancer. She was subsequently treated with primary chemoradiation under the care of Dr. Roselind Messier. She completed her therapy approximately in November of 2011 and has been followed conservatively since that time.  She last saw Dr. Roselind Messier in November of 2012 at which time her Pap smear showed atypical squamous cells of undetermined significance. She was seen by him again in may of this year at which time a Pap smear revealed markedly atypical cells highly suspicious for malignancy. The pathology comment was that there were groups of markedly atypical cells with significant nuclear pleomorphism and a very high [place] ratio. Physical examination was recommended that she comes in today for the above.  She states that she did not know why she is being seen by [place] today. She is concerned about having a biopsy she remember it being quite painful when she had them prior to her diagnosis. Interval History: As above   Review of Systems: She denies any chest pain, nausea, fevers, chills. Denies any change in her bowel or bladder habits. She denies any vaginal bleeding. She is sexually active about once a week. She denies any dyspareunia or postcoital bleeding. She's not using her dilator says she is sexually active. She denies any unintentional weight loss weight gain or cough.   Current Meds:  No outpatient encounter  prescriptions on file as of 06/26/2011.    Allergy: No Known Allergies  Social Hx:   History   Social History  . Marital Status: Single    Spouse Name: N/A    Number of Children: N/A  . Years of Education: N/A   Occupational History  .      widowed   Social History Main Topics  . Smoking status: Never Smoker   . Smokeless tobacco: Not on file  . Alcohol Use: No  . Drug Use: No  . Sexually Active: Not on file   Other Topics Concern  . Not on file   Social History Narrative  . No narrative on file    Past Surgical Hx:  Past Surgical History  Procedure Date  . Bilateral salpingoophorectomy 2011    Bilateral pelvic lymph node dissection    Past Medical Hx:  Past Medical History  Diagnosis Date  . Cervical cancer 07-27-2009 CLINICAL IB1    POORLY DIFFERENTIATED SQUAMOUS CELL CARCINOMA  . Anemia     MILD MICROCYTIC ANEMIA  . History of chemotherapy 11/15/09    Cisplatin 4 cycles weekly completed 11/15/09  . Hx of radiation therapy 10/10/09-11/14/09    cervical   . Hx of radiation therapy 12/14/09-12/17/09    cesium implant intracavity    Family Hx: No family history on file.  Vitals:  Blood pressure 118/76, pulse 64, temperature 97.9 F (36.6 C), temperature source Oral, resp. rate 18, height 5' 3.07" (1.602 m), weight 161 lb (73.029 kg).  Physical Exam:   well-nourished well-developed female in no  acute distress.  Pelvic: Normal external female genitalia. The vagina is atrophic. The cervix is flush with the vagina. There no gross visible lesions. Cervical biopsy was performed at 12:00. Endocervical curettage was performed. The patient tolerated the procedure well. Hemostasis was obtained using silver nitrate. The biopsy was difficult secondary to the flush nature of the cervix and the vagina. Bimanual examination the cervix is palpably normal. The corpus is of normal size shape and consistency. There are no adnexal masses. Rectovaginal confirms the above. There is no  parametrial involvement or nodularity.  Assessment/Plan: 46 year old with metastatic stage IB 1 squamous cell carcinoma cervix who had an abnormal Pap smear last week. We will call her with the results of her biopsies from today determine her disposition and need for her radiographic studies based on the results.   Micheal Sheen A., MD 06/26/2011, 11:50 AM

## 2011-06-28 ENCOUNTER — Telehealth: Payer: Self-pay | Admitting: Gynecologic Oncology

## 2011-06-28 NOTE — Telephone Encounter (Signed)
Translator to call patient with biopsy results: no signs of malignancy.  To inform patient that she would be notified of a follow up appointment once Dr. Duard Brady reviewed the path.

## 2011-07-01 ENCOUNTER — Ambulatory Visit: Payer: Self-pay | Admitting: Radiation Oncology

## 2011-07-10 ENCOUNTER — Encounter: Payer: Self-pay | Admitting: Gynecologic Oncology

## 2011-07-10 NOTE — Progress Notes (Signed)
Patient approved for 100% EPP discount for family of 2 no income.

## 2011-09-24 ENCOUNTER — Encounter: Payer: Self-pay | Admitting: Gynecologic Oncology

## 2011-09-24 ENCOUNTER — Ambulatory Visit: Payer: Self-pay | Attending: Gynecologic Oncology | Admitting: Gynecologic Oncology

## 2011-09-24 VITALS — BP 110/70 | HR 64 | Temp 98.9°F | Resp 16

## 2011-09-24 DIAGNOSIS — C539 Malignant neoplasm of cervix uteri, unspecified: Secondary | ICD-10-CM | POA: Insufficient documentation

## 2011-09-24 DIAGNOSIS — Z923 Personal history of irradiation: Secondary | ICD-10-CM | POA: Insufficient documentation

## 2011-09-24 DIAGNOSIS — Z9221 Personal history of antineoplastic chemotherapy: Secondary | ICD-10-CM | POA: Insufficient documentation

## 2011-09-24 NOTE — Progress Notes (Signed)
Follow-up Note: Gyn-Onc   Diane King 46 y.o. female  CC:  Chief Complaint  Patient presents with  . squamous cell carcinoma of cervix    follow-up visit     ZOX:[id number] for stage IB cervical cancer.  HISTORY OF PRESENT ILLNESS: This is a 46 year old who presented with  evidence of a stage IB cervical carcinoma. 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   Interval History: The patient was seen by Dr. Roselind Messier in November of 2012. A Pap test at that time returned with atypical squamous cells of undetermined significance. The features favor a reparative process.   A repeat Pap test in May of 2013 demonstrated rare markedly atypical cells highly suspicious for malignancy. She was evaluated on 06/26/2011 at which time colposcopy was performed. Cervical biopsy and was notable for scant inflamed squamous mucosa with underlying stromal fibrosis without any dysplasia or malignancy. The endocervical curettage showed rare single atypical cells and scanty reactive endocervical and squamous epithelial cells associated with acute and chronic inflammation .  Current Meds:  No outpatient encounter prescriptions on file as of 09/24/2011.    Allergy: No Known Allergies  Social Hx:   History   Social History  . Marital Status: Single    Spouse Name: N/A    Number of Children: N/A  . Years of Education: N/A   Occupational History  .      widowed   Social History Main Topics  . Smoking status: Never Smoker   . Smokeless tobacco: Not on file  . Alcohol Use: No  . Drug Use: No  . Sexually Active: Yes   Other Topics Concern  . Not on file   Social History Narrative  . No narrative on file    Past Surgical  Hx:  Past Surgical History  Procedure Date  . Bilateral salpingoophorectomy 2011    Bilateral pelvic lymph node dissection    Past Medical Hx:  Past Medical History  Diagnosis Date  . Cervical cancer 07-27-2009 CLINICAL IB1    POORLY DIFFERENTIATED SQUAMOUS CELL CARCINOMA  . Anemia     MILD MICROCYTIC ANEMIA  . History of chemotherapy 11/15/09    Cisplatin 4 cycles weekly completed 11/15/09  . Hx of radiation therapy 10/10/09-11/14/09    cervical   . Hx of radiation therapy 12/14/09-12/17/09    cesium implant intracavity    Family Hx: History reviewed. No pertinent family history.  Review of Systems:  Constitutional  Feels well  Cardiovascular  No chest pain, shortness of breath, or edema  Pulmonary  No cough or wheeze.  Gastro Intestinal  No nausea, vomitting, or diarrhoea. No bright red blood per rectum, no abdominal pain, change in bowel movement, or constipation.  Genito Urinary  No frequency, urgency, dysuria, no bleeding or discharge.  Musculo Skeletal  No myalgia, arthralgia, joint swelling or pain  Neurologic  No weakness, numbness, change in gait,  Psychology  No depression, anxiety, insomnia.     Vitals:  Blood pressure 110/70, pulse 64, temperature 98.9 F (37.2 C), temperature source Oral, resp. rate 16.  Physical Exam: WD female in NAD Neck  Supple without any enlargements.  Lymph node survey. No cervical supraclavicular cervical or inguinal adenopathy Cardiovascular  Pulse normal rate, regularity and rhythm. Lungs  Clear  to auscultation bilateraly,  Psychiatry  Alert and oriented to person, place, and time  Back No CVA tenderness Abdomen  Normoactive bowel sounds, abdomen soft, non-tender and obese. Surgical  sites intact without evidence of hernia.  Genito Urinary  Vulva/vagina: Normal external female genitalia.  No lesions.   Bladder/urethra:  No lesions or masses  Cervix The cervix is flush with the vaginal vault, no lesions visualized. The  vagina is atrophic Rectal  Good tone, no masses no cul de sac nodularity.  Extremities  No bilateral cyanosis, clubbing or edema.    Assessment/Plan:  This is a 46 y.o. year old stage IB cervical carcinoma treated with definitive chemoradiation.  The last 2 Pap tests in November 2012 in May 2013 demonstrated atypical squamous cells colposcopy with biopsy in May of 2013 is not diagnostic of recurrent disease. Close followup is advised.  Follow-up with Dr. Roselind Messier in 3 months. Follow up with GYN oncology in 6 months  A translator was present for the entirety of this encounter  [name], MD., PhD. 09/24/2011, 5:17 PM

## 2011-09-24 NOTE — Patient Instructions (Addendum)
Follow-up with Dr. Roselind Messier in 3 months. Follow up with GYN oncology in 6 months   Thank you very much [name] for allowing me to provide care for you today.  I appreciate your confidence in choosing our Gynecologic Oncology team.  If you have any questions about your visit today please call our office and we will get back to you as soon as possible.  [name] MD., PhD Gynecologic Oncology

## 2011-12-16 ENCOUNTER — Ambulatory Visit: Payer: Self-pay | Admitting: Radiation Oncology

## 2011-12-23 ENCOUNTER — Other Ambulatory Visit (HOSPITAL_COMMUNITY)
Admission: RE | Admit: 2011-12-23 | Discharge: 2011-12-23 | Disposition: A | Discharge status: Home / Self Care | Payer: Self-pay | Source: Ambulatory Visit | Attending: Radiation Oncology | Admitting: Radiation Oncology

## 2011-12-23 ENCOUNTER — Encounter: Payer: Self-pay | Admitting: Radiation Oncology

## 2011-12-23 ENCOUNTER — Ambulatory Visit
Admission: RE | Admit: 2011-12-23 | Discharge: 2011-12-23 | Disposition: A | Discharge status: Home / Self Care | Payer: Self-pay | Source: Ambulatory Visit | Attending: Radiation Oncology | Admitting: Radiation Oncology

## 2011-12-23 VITALS — BP 118/72 | HR 85 | Temp 98.3°F | Wt 163.2 lb

## 2011-12-23 DIAGNOSIS — Z923 Personal history of irradiation: Secondary | ICD-10-CM

## 2011-12-23 DIAGNOSIS — C539 Malignant neoplasm of cervix uteri, unspecified: Secondary | ICD-10-CM

## 2011-12-23 DIAGNOSIS — Z01419 Encounter for gynecological examination (general) (routine) without abnormal findings: Secondary | ICD-10-CM | POA: Insufficient documentation

## 2011-12-23 NOTE — Progress Notes (Signed)
  Radiation Oncology         ([phone]) [phone] ________________________________  Name: Diane King MRN: [id number]  Date: 12/23/2011  DOB: Sep 19, 1965  Follow-Up Visit Note  CC: No primary provider on file.  Felix Ahmadi, MD  Diagnosis:   Stage IB cervical cancer  Interval Since Last Radiation:  2 years  Narrative:  The patient returns today for routine follow-up.  She denies any pelvic pain or vaginal bleeding. She denies any urination difficulties or rectal bleeding.  Did undergo colposcopy and biopsy for abnormal Pap smear earlier this year.                              ALLERGIES:   has no known allergies.  Meds: No current outpatient prescriptions on file.    Physical Findings: The patient is in no acute distress. Patient is alert and oriented.  weight is 163 lb 3.2 oz (74.027 kg). Her temperature is 98.3 F (36.8 C). Her blood pressure is 118/72 and her pulse is 85. Marland Kitchen  No palpable cervical or supraclavicular adenopathy. The lungs are clear to auscultation. The heart has regular rhythm and rate. The abdomen is soft and nontender with normal bowel sounds. There is no inguinal adenopathy appreciated.  On pelvic examination the external genitalia are unremarkable. A speculum exam is performed. There are no mucosal lesions noted in the vaginal vault. The cervical os can be identified on exam. A Pap smear was obtained of the cervical region. On bimanual and rectovaginal examination there no pelvic masses appreciated.  Lab Findings: Lab Results  Component Value Date   WBC 3.0* 06/21/2010   HGB 11.5* 06/21/2010   HCT 34.5* 06/21/2010   MCV 88.6 06/21/2010   PLT 199 06/21/2010    @LASTCHEM @  Radiographic Findings: No results found.  Impression: No evidence of recurrence on clinical exam today, Pap smear pending.  Plan:  Routine followup in 6 months. In the interim the patient will be seen by gynecologic oncology.  _____________________________________   [name], PhD,  MD

## 2011-12-23 NOTE — Progress Notes (Signed)
Patient here for routine follow up completion of cervical radiation.Denies pain, discharge or bleeding.No spanish interpreter available today.

## 2011-12-30 ENCOUNTER — Telehealth: Payer: Self-pay

## 2012-01-02 ENCOUNTER — Telehealth: Payer: Self-pay

## 2012-01-02 NOTE — Telephone Encounter (Signed)
Called patient to return call and she did call Raynelle Fanning the interpreter that someone had called from [facility] so Raynelle Fanning (spanish interpreter) came by on 01/01/12 and was given good news of pap smear results negative for malignancy and also given appointment for follow up in six months

## 2012-01-20 ENCOUNTER — Encounter: Payer: Self-pay | Admitting: Radiation Oncology

## 2012-01-20 ENCOUNTER — Other Ambulatory Visit: Payer: Self-pay | Admitting: Physician Assistant

## 2012-01-20 NOTE — Progress Notes (Signed)
Patient came in with Diane King the intrpotor. Renewing her financial application. She had an old letter from her son that she used last time when [place] did her application. I advised her we need new one with him signing and dating it, and to mail to the address on application. She is not working and her son is her source of income. She had someone's card but the correct letter of her approval from last time. She just noticed it. I advised her ok since all in the system, someone's green card was mailed to her in error. She was 100% before.

## 2012-03-19 ENCOUNTER — Encounter: Payer: Self-pay | Admitting: Gynecologic Oncology

## 2012-03-19 ENCOUNTER — Ambulatory Visit: Payer: Self-pay | Attending: Gynecologic Oncology | Admitting: Gynecologic Oncology

## 2012-03-19 VITALS — BP 110/70 | HR 64 | Temp 97.5°F | Resp 16 | Ht 63.0 in | Wt 166.0 lb

## 2012-03-19 DIAGNOSIS — C539 Malignant neoplasm of cervix uteri, unspecified: Secondary | ICD-10-CM

## 2012-03-19 DIAGNOSIS — C778 Secondary and unspecified malignant neoplasm of lymph nodes of multiple regions: Secondary | ICD-10-CM | POA: Insufficient documentation

## 2012-03-19 NOTE — Progress Notes (Signed)
Follow-up Note: Gyn-Onc   Diane King 46 y.o. female  CC:  Chief Complaint  Patient presents with  . Cervical Cancer    Follow up    YNW:[id number] for stage IB cervical cancer.  HISTORY OF PRESENT ILLNESS: This is a 47 year old who presented with  evidence of a stage IB cervical carcinoma. 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   Interval History:   Diane King is doing well.  Reports weight gain.  No outpatient encounter prescriptions on file as of 03/19/2012.    Social Hx:   History   Social History  . Marital Status: Single    Spouse Name: N/A    Number of Children: N/A  . Years of Education: N/A   Occupational History  .      widowed   Social History Main Topics  . Smoking status: Never Smoker   . Smokeless tobacco: Not on file  . Alcohol Use: No  . Drug Use: No  . Sexually Active: Yes   Other Topics Concern  . Not on file   Social History Narrative  . No narrative on file    Past Surgical Hx:  Past Surgical History  Procedure Date  . Bilateral salpingoophorectomy 2011    Bilateral pelvic lymph node dissection    Past Medical Hx:  Past Medical History  Diagnosis Date  . Cervical cancer 07-27-2009 CLINICAL IB1    POORLY DIFFERENTIATED SQUAMOUS CELL CARCINOMA  . Anemia     MILD MICROCYTIC ANEMIA  . History of chemotherapy 11/15/09    Cisplatin 4 cycles weekly completed 11/15/09  . Hx of radiation therapy 10/10/09-11/14/09    cervical   . Hx of radiation therapy 12/14/09-12/17/09    cesium implant intracavity    Family Hx: History reviewed. No pertinent family history.  Review of Systems:  Constitutional  Feels well,  Reports weight gain Cardiovascular   No chest pain, shortness of breath, or edema  Pulmonary  No cough or wheeze.  Gastro Intestinal  No nausea, vomitting, or diarrhoea. No bright red blood per rectum, no abdominal pain, change in bowel movement, or constipation.  Genito Urinary  No frequency, urgency, dysuria, no bleeding or discharge.  Musculo Skeletal  No myalgia, arthralgia, joint swelling or pain  Neurologic  No weakness, numbness, change in gait,  Psychology  No depression, anxiety, insomnia.     Vitals:  Blood pressure 110/70, pulse 64, temperature 97.5 F (36.4 C), resp. rate 16, height 5\' 3"  (1.6 m), weight 166 lb (75.297 kg).  Physical Exam: WD female in NAD Neck  Supple without any enlargements.  Lymph node survey. No cervical supraclavicular cervical or inguinal adenopathy Cardiovascular  Pulse normal rate, regularity and rhythm. Lungs  Clear to auscultation bilateraly,  Psychiatry  Alert and oriented to person, place, and time  Back No CVA tenderness Abdomen  Normoactive bowel sounds, abdomen soft, non-tender and obese.Midline surgical incision intact without evidence of hernia.  Genito Urinary  Vulva/vagina: Normal external female genitalia.  No lesions.   Bladder/urethra:  No lesions or masses  Cervix The cervix is flush with the vaginal vault, no lesions visualized. The vagina is atrophic no parametrial nodularity Rectal  Good tone, no masses no cul de sac nodularity.  Extremities  No bilateral cyanosis, clubbing  or edema.    Assessment/Plan:  This is a 47 y.o. year old stage IB cervical carcinoma treated with definitive chemoradiation.  The last 2 Pap tests in November 2012 in May 2013 demonstrated atypical squamous cells colposcopy with biopsy in May of 2013 is not diagnostic of recurrent disease. Pap 12/2011 wnl NED Follow-up with Dr. Roselind Messier in 3 months. Follow up with GYN oncology in 6 months  Patient advised to decrease portion size and to use a pedometer to assess her daily  activity.  [name], MD., PhD. 03/19/2012, 3:28 PM

## 2012-03-19 NOTE — Patient Instructions (Addendum)
Follow-up with Dr. Roselind Messier in 3 months. Follow up with GYN oncology in 6 months  Decrease portion size and to use a pedometer to assess your daily activity.

## 2012-03-24 ENCOUNTER — Other Ambulatory Visit: Payer: Self-pay | Admitting: Radiation Oncology

## 2012-03-24 DIAGNOSIS — C539 Malignant neoplasm of cervix uteri, unspecified: Secondary | ICD-10-CM

## 2012-04-16 ENCOUNTER — Ambulatory Visit: Payer: Self-pay | Admitting: Gynecologic Oncology

## 2012-05-28 ENCOUNTER — Other Ambulatory Visit: Payer: Self-pay | Admitting: Obstetrics and Gynecology

## 2012-05-28 DIAGNOSIS — Z1231 Encounter for screening mammogram for malignant neoplasm of breast: Secondary | ICD-10-CM

## 2012-06-09 ENCOUNTER — Ambulatory Visit (HOSPITAL_COMMUNITY)
Admission: RE | Admit: 2012-06-09 | Discharge: 2012-06-09 | Disposition: A | Discharge status: Home / Self Care | Payer: Self-pay | Source: Ambulatory Visit | Attending: Obstetrics and Gynecology | Admitting: Obstetrics and Gynecology

## 2012-06-09 ENCOUNTER — Encounter (HOSPITAL_COMMUNITY): Payer: Self-pay

## 2012-06-09 ENCOUNTER — Ambulatory Visit (HOSPITAL_COMMUNITY): Payer: Self-pay

## 2012-06-09 VITALS — BP 116/72 | Temp 98.5°F | Ht 63.5 in | Wt 168.6 lb

## 2012-06-09 DIAGNOSIS — Z1239 Encounter for other screening for malignant neoplasm of breast: Secondary | ICD-10-CM

## 2012-06-09 NOTE — Patient Instructions (Signed)
Taught patient how to perform BSE. Patient did not need a Pap smear today due to last Pap smear was 12/23/2011 at the [facility]. Patient has a history of cervical cancer cancer and gets Pap smears every 6 months per recommendation. Screening mammogram scheduled at [facility] Monday, Jun 22, 2012 at 1530. Patient aware of appointment and will be there. Patient verbalized understanding.

## 2012-06-09 NOTE — Progress Notes (Signed)
No complaints today.  Pap Smear:    Pap smear not completed today. Last Pap smear was 12/23/2011 at the [facility] and normal. Patient has a history of cervical cancer and gets Pap smears every 6 months for follow up at the [facility]. Pap smear results in EPIC.  Physical exam: Breasts Breasts symmetrical. No skin abnormalities bilateral breasts. No nipple retraction bilateral breasts. No nipple discharge bilateral breasts. No lymphadenopathy. No lumps palpated bilateral breasts. No complaints of pain or tenderness on exam. Screening mammogram scheduled at [facility]ography on Monday, Jun 22, 2012 at 1530.      Pelvic/Bimanual No Pap smear completed today since last Pap smear was 12/23/2011. Pap smear not indicated per BCCCP guidelines.

## 2012-06-22 ENCOUNTER — Encounter: Payer: Self-pay | Admitting: Radiation Oncology

## 2012-06-22 ENCOUNTER — Other Ambulatory Visit (HOSPITAL_COMMUNITY)
Admission: RE | Admit: 2012-06-22 | Discharge: 2012-06-22 | Disposition: A | Discharge status: Home / Self Care | Payer: Self-pay | Source: Ambulatory Visit | Attending: Radiation Oncology | Admitting: Radiation Oncology

## 2012-06-22 ENCOUNTER — Ambulatory Visit
Admission: RE | Admit: 2012-06-22 | Discharge: 2012-06-22 | Disposition: A | Discharge status: Home / Self Care | Payer: Self-pay | Source: Ambulatory Visit | Attending: Radiation Oncology | Admitting: Radiation Oncology

## 2012-06-22 ENCOUNTER — Ambulatory Visit (HOSPITAL_COMMUNITY)
Admission: RE | Admit: 2012-06-22 | Discharge: 2012-06-22 | Disposition: A | Discharge status: Home / Self Care | Payer: Self-pay | Source: Ambulatory Visit | Attending: Obstetrics and Gynecology | Admitting: Obstetrics and Gynecology

## 2012-06-22 VITALS — BP 108/62 | HR 80 | Temp 98.9°F | Resp 20 | Wt 167.2 lb

## 2012-06-22 DIAGNOSIS — Z1231 Encounter for screening mammogram for malignant neoplasm of breast: Secondary | ICD-10-CM

## 2012-06-22 DIAGNOSIS — C539 Malignant neoplasm of cervix uteri, unspecified: Secondary | ICD-10-CM

## 2012-06-22 DIAGNOSIS — Z01419 Encounter for gynecological examination (general) (routine) without abnormal findings: Secondary | ICD-10-CM | POA: Insufficient documentation

## 2012-06-22 NOTE — Progress Notes (Signed)
  Radiation Oncology         [phone] ________________________________  Name: Diane King MRN: [id number]  Date: 06/22/2012  DOB: [birth year]  Follow-Up Visit Note  CC: No primary provider on file.  Felix Ahmadi, MD  Diagnosis:   Stage I-B squamous cell carcinoma of the cervix  Interval Since Last Radiation:  2-1/2 years, the patient completed external beam as well as cesium 137 implant  Narrative:  The patient returns today for routine follow-up.  She is doing well and without complaints. Patient has been inconsistent with using her vaginal dilator and I stress the importance of this issue. She denies any vaginal bleeding rectal bleeding hematuria pelvic or low back pain.                              ALLERGIES:  has No Known Allergies.  Meds: No current outpatient prescriptions on file.   No current facility-administered medications for this encounter.    Physical Findings: The patient is in no acute distress. Patient is alert and oriented.  weight is 167 lb 3.2 oz (75.841 kg). Her oral temperature is 98.9 F (37.2 C). Her blood pressure is 108/62 and her pulse is 80. Her respiration is 20. Marland Kitchen  No palpable supraclavicular or axillary adenopathy the lungs are clear to auscultation. The heart has regular rhythm and rate. The abdomen is soft and nontender with normal bowel sounds. The inguinal areas are free of adenopathy. On pelvic examination external genitalia are unremarkable.  On speculum examination there no mucosal lesions noted. A Pap smear was obtained of the cervical area. On bimanual and rectovaginal examination there no pelvic masses appreciated.     Impression:  No evidence of recurrence on clinical exam today, Pap smear pending  Plan:  Routine followup in 6 months. In the interim the patient will be seen in gynecologic oncology.  _____________________________________  -----------------------------------  [name], PhD, MD

## 2012-06-22 NOTE — Progress Notes (Addendum)
Spanish interpreter w/pt today. Pt denies pain, bladder/bowel issues, fatigue, loss of appetite.  Pap performed by MD and sent specimen to lab, patient tolerated well, gave 6 month f/u appt to patient and medium  Dilator, asked [name],interpreter to tell patient to use Mon,Wed, & Friday nights as directions indicated, 4:22 PM

## 2012-06-25 ENCOUNTER — Telehealth: Payer: Self-pay | Admitting: *Deleted

## 2012-06-25 NOTE — Telephone Encounter (Signed)
Per Dr Roselind Messier, spoke w/[name], Spanish interpreter; requested she call pt and inform her 06/22/12 "pap smear results are normal, good per Dr Roselind Messier". Diane King verbalized understanding, agreed to notify pt.

## 2012-10-13 ENCOUNTER — Ambulatory Visit: Payer: Self-pay | Attending: Gynecologic Oncology | Admitting: Gynecologic Oncology

## 2012-10-13 VITALS — BP 102/66 | HR 86 | Temp 99.2°F | Resp 16

## 2012-10-13 DIAGNOSIS — C539 Malignant neoplasm of cervix uteri, unspecified: Secondary | ICD-10-CM | POA: Insufficient documentation

## 2012-10-13 DIAGNOSIS — Z923 Personal history of irradiation: Secondary | ICD-10-CM | POA: Insufficient documentation

## 2012-10-13 DIAGNOSIS — Z9221 Personal history of antineoplastic chemotherapy: Secondary | ICD-10-CM | POA: Insufficient documentation

## 2012-10-13 DIAGNOSIS — C778 Secondary and unspecified malignant neoplasm of lymph nodes of multiple regions: Secondary | ICD-10-CM | POA: Insufficient documentation

## 2012-10-13 NOTE — Progress Notes (Signed)
GYNECOLOGIC ONCOLOGY OFFICE VISIT   Diane King 47 y.o. female  CC:  Chief Complaint  Patient presents with  . Cervical Cancer    Folllow up     CHIEF COMPLAINT: Surveillance for stage IB cervical cancer.   HISTORY OF PRESENT ILLNESS: This is a 47 year old who presented with  evidence of a stage IB cervical carcinoma. 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  INTERVAL NOTE  Diane King is doing well.  Is using herbal life and he feels as if she has more energy and overall feels much better.  Pap test collected in May of 2014 was within normal limits  SOCIAL HISTORY:  History   Social History  . Marital Status: Single    Spouse Name: N/A    Number of Children: N/A  . Years of Education: N/A   Occupational History  .      widowed   Social History Main Topics  . Smoking status: Never Smoker   . Smokeless tobacco: Not on file  . Alcohol Use: No  . Drug Use: No  . Sexual Activity: Yes   Other Topics Concern  . Not on file   Social History Narrative  . No narrative on file    PAST SURGICAL HISTORY  Past Surgical History  Procedure Laterality Date  . Bilateral salpingoophorectomy  2011    Bilateral pelvic lymph node dissection    PAST MEDICAL HISTORY  Past Medical History  Diagnosis Date  . Cervical cancer 07-27-2009 CLINICAL IB1    POORLY DIFFERENTIATED SQUAMOUS CELL CARCINOMA  . Anemia     MILD MICROCYTIC ANEMIA  . History of chemotherapy 11/15/09    Cisplatin 4 cycles weekly completed 11/15/09  . Hx of radiation therapy 10/10/09-11/14/09    cervical   . Hx of radiation therapy 12/14/09-12/17/09    cesium implant intracavity    FAMILY HISTORY  Family History  Problem  Relation Age of Onset  . Breast cancer Sister     REVIEW OF SYSTEMS Constitutional  Feels well,  Cardiovascular  No chest pain, shortness of breath, or edema  Pulmonary  No cough or wheeze.  Gastro Intestinal  No nausea, vomitting, or diarrhoea. No bright red blood per rectum, no abdominal pain, change in bowel movement, or constipation.  Genito Urinary  No frequency, urgency, dysuria, no bleeding or discharge.  Musculo Skeletal  No myalgia, arthralgia, joint swelling or pain  Neurologic  No weakness, numbness, change in gait,  Psychology  No depression, anxiety, insomnia.     Vitals:  Blood pressure 102/66, pulse 86, temperature 99.2 F (37.3 C), temperature source Oral, resp. rate 16.  PHYSICAL EXAMINATION WD female in NAD Neck  Supple without any enlargements.  Lymph node survey. No cervical supraclavicular cervical or inguinal adenopathy Cardiovascular  Pulse normal rate, regularity and rhythm. Lungs  Clear to auscultation bilateraly,  Psychiatry  Alert and oriented to person, place, and time  Back No CVA tenderness Abdomen  Normoactive bowel sounds, abdomen soft, non-tender and obese.  Midline surgical incision intact without evidence of hernia.  Genito Urinary  Vulva/vagina: Normal external female genitalia.  No lesions.   Bladder/urethra:  No lesions or masses  Cervix The cervix is flush with the vaginal vault, no lesions visualized. The vagina is  atrophic no parametrial nodularity Rectal  Good tone, no masses no cul de sac nodularity.  Extremities  No bilateral cyanosis, clubbing or edema.    Assessment:  This is a 47 y.o. year old stage IB cervical carcinoma treated with definitive chemoradiation.  The last 2 Pap tests in November 2012 in May 2013 demonstrated atypical squamous cells colposcopy with biopsy in May of 2013 is not diagnostic of recurrent disease. Pap 12/2011  06/2012 wnl NED Plan Follow-up with Dr. Roselind Messier in November 2014 as  scheduled. Follow up with GYN oncology in May 2015  [name], MD., PhD. 10/13/2012, 4:08 PM

## 2012-10-13 NOTE — Patient Instructions (Signed)
F/U 06/2012   Thank you very much Diane King for allowing me to provide care for you today.  I appreciate your confidence in choosing our Gynecologic Oncology team.  If you have any questions about your visit today please call our office and we will get back to you as soon as possible.  [name]. Akiera Allbaugh MD., PhD Gynecologic Oncology

## 2012-12-21 ENCOUNTER — Other Ambulatory Visit (HOSPITAL_COMMUNITY)
Admission: RE | Admit: 2012-12-21 | Discharge: 2012-12-21 | Disposition: A | Discharge status: Home / Self Care | Payer: Self-pay | Source: Ambulatory Visit | Attending: Radiation Oncology | Admitting: Radiation Oncology

## 2012-12-21 ENCOUNTER — Encounter: Payer: Self-pay | Admitting: Radiation Oncology

## 2012-12-21 ENCOUNTER — Ambulatory Visit
Admission: RE | Admit: 2012-12-21 | Discharge: 2012-12-21 | Disposition: A | Discharge status: Home / Self Care | Payer: Self-pay | Source: Ambulatory Visit | Attending: Radiation Oncology | Admitting: Radiation Oncology

## 2012-12-21 VITALS — BP 106/64 | HR 81 | Temp 98.2°F | Ht 63.5 in | Wt 172.0 lb

## 2012-12-21 DIAGNOSIS — R87810 Cervical high risk human papillomavirus (HPV) DNA test positive: Secondary | ICD-10-CM | POA: Insufficient documentation

## 2012-12-21 DIAGNOSIS — Z124 Encounter for screening for malignant neoplasm of cervix: Secondary | ICD-10-CM | POA: Insufficient documentation

## 2012-12-21 DIAGNOSIS — C539 Malignant neoplasm of cervix uteri, unspecified: Secondary | ICD-10-CM

## 2012-12-21 DIAGNOSIS — Z1151 Encounter for screening for human papillomavirus (HPV): Secondary | ICD-10-CM | POA: Insufficient documentation

## 2012-12-21 NOTE — Progress Notes (Signed)
Diane King here with her interpreter for follow up after treatment for cervical cancer.  She denies bladder and bowel issues.  She denies vaginal discharge and bleeding and rectal bleeding.  She has a little fatigue which she thinks is due to "overweight."

## 2012-12-21 NOTE — Progress Notes (Signed)
Pap smear collected by Dr.Kinard, patient tolerated well, sent specimen to the lab, 1 year follow up appt card given to patient and interpreter Raynelle Fanning with patient 4:17 PM

## 2012-12-21 NOTE — Progress Notes (Signed)
  Radiation Oncology         [phone] ________________________________  Name: Diane King MRN: [id number]  Date: 12/21/2012  DOB: 1965/06/16  Follow-Up Visit Note  CC: No primary provider on file.  Felix Ahmadi, MD  Diagnosis:  Stage IB cervical cancer  Interval Since Last Radiation:  3 years, the patient completed external beam radiation therapy as well as cesium 137 implant.    Narrative:  The patient returns today for routine follow-up.  She is doing well and without complaints. She denies any pelvic pain or vaginal bleeding or rectal bleeding. Interpreter is present for the evaluation.                              ALLERGIES:  has No Known Allergies.  Meds: No current outpatient prescriptions on file.   No current facility-administered medications for this encounter.    Physical Findings: The patient is in no acute distress. Patient is alert and oriented.  height is 5' 3.5" (1.613 m) and weight is 172 lb (78.019 kg). Her temperature is 98.2 F (36.8 C). Her blood pressure is 106/64 and her pulse is 81. Marland Kitchen  No palpable cervical or supraclavicular adenopathy.  the lungs are clear to auscultation. The heart has regular rhythm and rate. The abdomen is soft and nontender with normal bowel sounds. There is no inguinal adenopathy. On pelvic examination the external genitalia are unremarkable. A speculum exam is performed. There are no mucosal lesions noted in the vaginal vault. A Pap smear was obtained of the cervical os region. On bimanual and rectovaginal examination there no pelvic masses appreciated.  Lab Findings: Lab Results  Component Value Date   WBC 3.0* 06/21/2010   HGB 11.5* 06/21/2010   HCT 34.5* 06/21/2010   MCV 88.6 06/21/2010   PLT 199 06/21/2010      Radiographic Findings: No results found.  Impression:  No evidence of recurrence on clinical exam today, Pap smear pending  Plan:  Routine followup in one year. In the interim the patient will be seen by  gynecologic oncology.  _____________________________________ -----------------------------------  [name], PhD, MD

## 2012-12-21 NOTE — Addendum Note (Signed)
Encounter addended by: [name], MD on: 12/21/2012  5:56 PM<BR>     Documentation filed: Orders

## 2012-12-24 ENCOUNTER — Telehealth: Payer: Self-pay | Admitting: Oncology

## 2012-12-24 NOTE — Telephone Encounter (Signed)
Butler Denmark, Interpreter, and asked her to call Brittanya Winburn to let her know that her pap smear results were good per Dr. Roselind Messier.  Jlulie said she would call her today.

## 2013-06-08 ENCOUNTER — Other Ambulatory Visit: Payer: Self-pay | Admitting: Obstetrics and Gynecology

## 2013-06-08 DIAGNOSIS — Z1231 Encounter for screening mammogram for malignant neoplasm of breast: Secondary | ICD-10-CM

## 2013-06-29 ENCOUNTER — Encounter (HOSPITAL_COMMUNITY): Payer: Self-pay

## 2013-06-29 ENCOUNTER — Ambulatory Visit (HOSPITAL_COMMUNITY)
Admission: RE | Admit: 2013-06-29 | Discharge: 2013-06-29 | Disposition: A | Discharge status: Home / Self Care | Payer: Self-pay | Source: Ambulatory Visit | Attending: Obstetrics and Gynecology | Admitting: Obstetrics and Gynecology

## 2013-06-29 VITALS — BP 110/68 | Temp 99.1°F | Ht 62.5 in | Wt 177.8 lb

## 2013-06-29 DIAGNOSIS — Z1231 Encounter for screening mammogram for malignant neoplasm of breast: Secondary | ICD-10-CM

## 2013-06-29 DIAGNOSIS — Z1239 Encounter for other screening for malignant neoplasm of breast: Secondary | ICD-10-CM

## 2013-06-29 NOTE — Progress Notes (Signed)
No complaints today.  Pap Smear:  Pap smear not completed today. Last Pap smear was 12/21/2012 at the [facility] and ASCUS HPV+. Patient has a follow-up Pap smear scheduled on 07/26/2013 at the [facility]. Patient has a history of cervical cancer and is being followed by Dr. Skeet Latch at the [facility]. Pap smear results are in EPIC.  Physical exam: Breasts Breasts symmetrical. No skin abnormalities bilateral breasts. No nipple retraction bilateral breasts. No nipple discharge bilateral breasts. No lymphadenopathy. No lumps palpated bilateral breasts. No complaints of pain or tenderness on exam. Patient escorted to mammography for a screening mammogram.        Pelvic/Bimanual No Pap smear completed today since last Pap smear was 12/21/2012. Pap smear not indicated per BCCCP guidelines.

## 2013-06-29 NOTE — Patient Instructions (Signed)
Taught Diane King how to perform BSE and gave educational materials to take home. Diane King did not need a Pap smear today due to last Pap smear was 12/21/2012 and has follow-up Pap smear scheduled at the [facility] for 07/27/2013. Diane King aware of appointment and will be there. Let [facility] know the [facility] will follow up with her within the next few weeks with results by letter or phone. Nili Ayala-Lopez verbalized understanding. Diane King escorted to mammography for a screening mammogram.  Shirley Muscat, RN 2:17 PM

## 2013-07-02 ENCOUNTER — Other Ambulatory Visit: Payer: Self-pay

## 2013-07-02 ENCOUNTER — Ambulatory Visit (HOSPITAL_BASED_OUTPATIENT_CLINIC_OR_DEPARTMENT_OTHER): Payer: Self-pay

## 2013-07-02 VITALS — BP 112/78 | HR 80 | Temp 99.0°F | Resp 16 | Ht 61.75 in | Wt 175.8 lb

## 2013-07-02 DIAGNOSIS — Z0000 Encounter for general adult medical examination without abnormal findings: Secondary | ICD-10-CM

## 2013-07-02 LAB — GLUCOSE (CC13): GLUCOSE: 110 mg/dL (ref 70–140)

## 2013-07-02 LAB — HEMOGLOBIN A1C
HEMOGLOBIN A1C: 5.9 % — AB (ref <5.7)
Mean Plasma Glucose: 123 mg/dL — ABNORMAL HIGH (ref <117)

## 2013-07-02 LAB — LIPID PANEL
CHOLESTEROL: 215 mg/dL — AB (ref 0–200)
HDL: 40 mg/dL (ref >39)
LDL Cholesterol: 112 mg/dL — ABNORMAL HIGH (ref 0–99)
TRIGLYCERIDES: 317 mg/dL — AB (ref <150)
Total CHOL/HDL Ratio: 5.4 Ratio
VLDL: 63 mg/dL — ABNORMAL HIGH (ref 0–40)

## 2013-07-02 NOTE — Patient Instructions (Signed)
Discussed health assessment with patient. Will followed up for Health Coaching on Nutrition and Activity. Will be called with results of lab work next week. Patient verbalized understanding.

## 2013-07-02 NOTE — Progress Notes (Signed)
Patient is a new patient to the [facility] program and is currently a BCCCP patient effective 06/29/2013.   Clinical Measurements: Patient is 5 ft. 1 3/4 inches, weight 175.8 lbs, waist circumference 35 inches, and hip circumference 44 inches.   Medical History: Patient has no history of high cholesterol. Patient does not have a history of hypertension or diabetes. Per patient no diagnosed history of coronary heart disease, heart attack, heart failure, stroke/TIA, vascular disease or congenital heart defects.   Blood Pressure, Self-measurement: Patient states has no reason to check Blood pressure.  Nutrition Assessment: Patient stated that eats 1 fruit every day. Patient states she eats one to 2 to 3 servings of vegetables a day. Patient does eat 3 or more ounces of whole grains daily. Patient doesn't eat two or more servings of fish weekly. Patient states does not like seafood. Patient states she does not drink more than 36 ounces or 450 calories of beverages with added sugars weekly. Patient states she drinks a lot of water. Patient stated she does watch her salt intake.   Pysical Activity Assessment: Patient stated she does 600 minutes of moderate activity including walking and household chores.   Smoking Status: Patient has never smoked. Patient is not exposed to smoke.  Quality of Life Assessment: In assessing patient's quality of life she stated that out of the past 30 days that she has felt her health is good all of them. Patient also stated that in the past 30 days that her mental health is good including stress, depression and problems with emotions for all days. Patient did state that out of the past 30 days she felt her physical or mental health had not kept her from doing her usual activities including self-care, work or recreation.   Plan: Lab work will be done today including a lipid panel, blood glucose, and Hgb A1C. Will call lab results when they are finished. Patient will returned  for Health Coaching.

## 2013-07-16 ENCOUNTER — Ambulatory Visit: Payer: Self-pay | Attending: Internal Medicine | Admitting: Internal Medicine

## 2013-07-16 ENCOUNTER — Encounter: Payer: Self-pay | Admitting: Internal Medicine

## 2013-07-16 VITALS — BP 100/67 | HR 87 | Temp 99.3°F | Resp 20 | Wt 175.0 lb

## 2013-07-16 DIAGNOSIS — Z9221 Personal history of antineoplastic chemotherapy: Secondary | ICD-10-CM | POA: Insufficient documentation

## 2013-07-16 DIAGNOSIS — E663 Overweight: Secondary | ICD-10-CM

## 2013-07-16 DIAGNOSIS — Z8541 Personal history of malignant neoplasm of cervix uteri: Secondary | ICD-10-CM | POA: Insufficient documentation

## 2013-07-16 DIAGNOSIS — Z006 Encounter for examination for normal comparison and control in clinical research program: Secondary | ICD-10-CM | POA: Insufficient documentation

## 2013-07-16 DIAGNOSIS — Z923 Personal history of irradiation: Secondary | ICD-10-CM | POA: Insufficient documentation

## 2013-07-16 DIAGNOSIS — R87619 Unspecified abnormal cytological findings in specimens from cervix uteri: Secondary | ICD-10-CM

## 2013-07-16 DIAGNOSIS — E781 Pure hyperglyceridemia: Secondary | ICD-10-CM

## 2013-07-16 MED ORDER — GEMFIBROZIL 600 MG PO TABS: 600.0000 mg | ORAL_TABLET | Freq: Two times a day (BID) | ORAL | Status: DC

## 2013-07-16 NOTE — Progress Notes (Signed)
Patient here to establish care. She is part of the [facility] program. A1c-5.9 on 07/02/13, lipid panel done on 07/02/13. Patient requests nutrition education; she is interested in weight loss. Complaining of bilateral foot pain (she is unable to rate pain), but feels the pain is due to her weight. [employer] used.

## 2013-07-16 NOTE — Progress Notes (Signed)
Patient ID: Diane King, female   DOB: 01-04-1966, 48 y.o.   MRN: [id number]  BWL:[id number]  GOT:[id number]  DOB - October 30, 1965  CC:  Chief Complaint  Patient presents with  . Establish Care  . Hyperlipidemia       HPI: Diane King is a 48 y.o. female here today to establish medical care.  Patient was referred by [facility] program.  Patient was found to have elevated triglycerides and was referred for management.  Patient states that she feels like she is having a hard time losing weight and would like a nutrition class to assist her in managing her diet.  She reports that her last pap smear was in November and she has a history of multiple abnormal pap's.    No Known Allergies Past Medical History  Diagnosis Date  . Cervical cancer 07-27-2009 CLINICAL IB1    POORLY DIFFERENTIATED SQUAMOUS CELL CARCINOMA  . Anemia     MILD MICROCYTIC ANEMIA  . History of chemotherapy 11/15/09    Cisplatin 4 cycles weekly completed 11/15/09  . Hx of radiation therapy 10/10/09-11/14/09    cervical   . Hx of radiation therapy 12/14/09-12/17/09    cesium implant intracavity   No current outpatient prescriptions on file prior to visit.   No current facility-administered medications on file prior to visit.   Family History  Problem Relation Age of Onset  . Breast cancer Sister    History   Social History  . Marital Status: Single    Spouse Name: N/A    Number of Children: N/A  . Years of Education: N/A   Occupational History  .      widowed   Social History Main Topics  . Smoking status: Never Smoker   . Smokeless tobacco: Not on file  . Alcohol Use: No  . Drug Use: No  . Sexual Activity: Yes   Other Topics Concern  . Not on file   Social History Narrative  . No narrative on file   Review of Systems  Constitutional: Negative.   HENT: Negative for sore throat.   Eyes: Negative for blurred vision.  Respiratory: Positive for cough and sputum production (white).     Cardiovascular: Negative.   Gastrointestinal: Positive for constipation (occasional).  Musculoskeletal:       Foot pain-top of foot   Neurological: Negative for dizziness, tingling and headaches.  Endo/Heme/Allergies: Negative.   Psychiatric/Behavioral: Negative.    .    Objective:   Filed Vitals:   07/16/13 1555  BP: 100/67  Pulse: 87  Temp: 99.3 F (37.4 C)  Resp: 20    Physical Exam: Constitutional: Patient appears well-developed and well-nourished. No distress. HENT: Normocephalic, atraumatic, External right and left ear normal. Oropharynx is clear and moist.  Eyes: Conjunctivae and EOM are normal. PERRLA, no scleral icterus. Neck: Normal ROM. Neck supple. No JVD. No tracheal deviation. No thyromegaly. CVS: RRR, S1/S2 +, no murmurs, no gallops, no carotid bruit.  Pulmonary: Effort and breath sounds normal, no stridor, rhonchi, wheezes, rales.  Abdominal: Soft. BS +, no distension, tenderness, rebound or guarding.  Musculoskeletal: Normal range of motion. No edema and no tenderness.  Lymphadenopathy: No lymphadenopathy noted, cervical Neuro: Alert. Normal reflexes, muscle tone coordination. No cranial nerve deficit. Skin: Skin is warm and dry. No rash noted. Not diaphoretic. No erythema. No pallor. Psychiatric: Normal mood and affect. Behavior, judgment, thought content normal.  Lab Results  Component Value Date   WBC 3.0* 06/21/2010   HGB 11.5* 06/21/2010  HCT 34.5* 06/21/2010   MCV 88.6 06/21/2010   PLT 199 06/21/2010   Lab Results  Component Value Date   CREATININE 0.60 06/21/2010   BUN 15 06/21/2010   NA 139 06/21/2010   K 3.8 06/21/2010   CL 104 06/21/2010   CO2 25 06/21/2010    Lab Results  Component Value Date   HGBA1C 5.9* 07/02/2013   Lipid Panel     Component Value Date/Time   CHOL 215* 07/02/2013 0934   TRIG 317* 07/02/2013 0934   HDL 40 07/02/2013 0934   CHOLHDL 5.4 07/02/2013 0934   VLDL 63* 07/02/2013 0934   LDLCALC 112* 07/02/2013 0934        Assessment and plan:   Diane King was seen today for establish care and hyperlipidemia.  Diagnoses and associated orders for this visit:  Overweight - Amb Referral to Nutrition and Diabetic Education  Abnormal Pap smear of cervix - Ambulatory referral to Gynecology  Hypertriglyceridemia - gemfibrozil (LOPID) 600 MG tablet; Take 1 tablet (600 mg total) by mouth 2 (two) times daily before a meal.   Return in about 6 months (around 01/15/2014) for hypertriglycerides.   Lance Bosch, [facility] [phone] 07/20/2013, 9:55 AM

## 2013-07-16 NOTE — Patient Instructions (Addendum)
Hipertrigliceridemia  Dieta para niveles altos de triglicridos en sangre (Hypertriglyceridemia, Diet for High blood levels of Triglycerides) La mayora de las grasas que contienen los alimentos son los triglicridos. Los triglicridos en la sangre se almacenan como grasa en el cuerpo. Niveles altos de triglicridos en la sangre pueden ponerlo en un mayor riesgo de insuficiencia cardaca e ictus.  El nivel normal de triglicridos es menos del 150 mg/dl. Los niveles al lmite de ser altos estn entre 150 y 199 mg/dl. Los niveles altos estn entre 200 - 499 mg/dL, y los niveles muy altos son mayores que 500 mg/dL. La decisin de Arts administrator se basa en Development worker, community de un nivel elevado de los mismos. Para personas con niveles de triglicridos altos o en el lmite, el tratamiento incluye prdida de peso y [place]. Se recomiendan los medicamentos en aquellas personas con niveles de triglicridos [employer]. Muchas personas que necesitan tratamiento por tener niveles altos de triglicridos tienen sndrome metablico. Esto consiste en un conjunto de trastornos que incluyen: resistencia a la insulina, presin arterial elevada, problemas de coagulacin, colesterol y triglicridos altos. [place] LOS TRIGLICRIDOS  No deber comer nada [place] 4 horas antes del anlisis. El rango normal de triglicridos es de 10 a 301 miligramos por decilitro (mg/dl). Algunas personas podrn ConocoPhillips extremos (1000 o ms) pero el nivel de triglicridos es muy alto si est por encima de 150 mg/dl, dependiendo de qu otros factores de riesgo tiene para una enfermedad cardaca.  Las personas con niveles altos de triglicridos tambin podrn Best boy altos niveles de colesterol. Si tiene [employer] de colesterol y de triglicridos, el riesgo de falla cardaca es mayor que si slo tiene niveles altos de triglicridos. Los Coca Cola de colesterol es uno de los factores principales de  riesgo de [place]. CAMBIOS EN SU DIETA Su peso puede afectar el nivel de triglicridos en la [place]. Si est un 20% o ms por encima de su peso ideal, deber V[employer]om de triglicridos mediante la prdida de [place]. La mejor forma de combatir esto es comer menos y Engineer, drilling. Las grasas proporcionan ms caloras que cualquier otra comida. La mejor forma de perder peso es consumir menos grasa. Slo un 30% de las caloras totales debern provenir de grasas. Menos del 7% de las caloras totales de su dieta debern provenir de grasas saturadas. Una dieta baja en grasas y grasas saturadas es la misma que la que se realiza para Advertising copywriter. Al consumir una dieta baja en grasas, perder peso, bajar sus niveles de colesterol y [place] de triglicridos.  Consumir una dieta baja en grasas, en especial grasas saturadas, tambin ayudar a su cuerpo a bajar el nivel de triglicridos. Pregunte al nutricionista como saber cunta grasa puede comer segn el nmero de caloras que el mdico le ha indicado.  El ejercicio, adems de ayudar en la prdida de peso tambin puede ayudarlo a bajar los [place] de triglicridos.   El alcohol puede aumentar los [place] de triglicridos. Podr ser necesario que deje de consumir bebidas alcohlicas.  Demasiada cantidad de carbohidratos en su dieta tambin puede aumentar su nivel de triglicridos en sangre. Algunos carbohidratos complejos son necesarios en su dieta. Entre ellos se puede incluir pan, arroz, patatas, otros vegetales que posean almidn y Actor.  Reduzca los hidratos de carbono "simples". Esto puede incluir azcares puros, dulces, miel, y gelatina sin perder otros nutrientes. Si tiene el tipo de triglicridos altos que estn afectados por  el tipo de carbohidratos en su dieta, necesitar consumir menos azcar y menos alimentos con azcar. El profesional que lo asiste lo ayudar.  Aadir 2 a 4 gramos de aceite de pescado (AEP + ADH) tambin  puede ayudar a disminuir triglicridos. Consulte con el mdico antes de aadir suplementos a su dieta. Siga la dieta Mantenga un peso corporal adecuado. El profesional que lo asiste lo ayudar. En general, comer menos y hacer ms ejercicio lo ayudarn a Administrator, Civil Service. Puede ser de utilidad que segn un grupo de apoyo para el control del [place]. Consulte con su especialista acerca de los grupos de [place] de control de peso en su zona.  Consuma comidas bajas en grasa. Esto tambin puede ayudar a [employer] Omaha.  Estos alimentos son bajos en grasas. Coma MS alimentos de los siguientes:   Porotos y guisantes secos; lentejas.  Clara de [employer] Lilly and Company.  Requesn bajo en grasa  Pescado.  Cortes magros de carne, como [place], lomito, cuadril, Corporate treasurer (qutele la grasa extra).  Cereales, pastas y panes integrales.  Leche en polvo descremada sin grasa.  Yogur descremado.  Pollo sin piel.  Queso a base de leche descremada o semidescremada, como [place], parmesano, [place] fresco, ricota o requesn. Estos son alimentos Forensic psychologist. Coma MENOS alimentos de los siguientes:   Leche entera y derivados, como queso [place], [place], [place], [place] y [place].  Carnes altas en grasas, como fiambres, embutidos, salchichas de viena, bratwurst, costillas, carne en conserva, carne de cerdo picada y carne picada de vaca sin Ambulance person.  Alimentos fritos. Limite las grasas saturadas en su dieta. Sustituir las grasas insaturadas por saturadas puede ayudarlo a disminuir el nivel de triglicridos. Necesitar leer las etiquetas para saber qu productos contienen grasas saturadas.  Estos alimentos son altos en grasas saturadas. Coma MENOS alimentos de los siguientes:   [place].  Leche entera.  Piel y [place] de [place].  Aceite de palma.  [place].  [place].  Queso crema.  [place].  Margarinas y productos de repostera que contengan los aceites mencionados.  Margarinas vegetales.  Tripas de  cerdo.  Grasas de carnes.  Aceite de coco.  Aceite de almendra de palma.  Grasa de cerdo.  [place].  Nata.  [place] salado.  Blanqueadores para caf y cremas no lcteas hechas con los aceites mencionados.  [place] entera. Utilice grasas no saturadas (poliinsaturadas y monoinsaturadas) de forma moderada. Recuerde, aunque las grasas no saturadas son mejores que las saturadas, [place] debera consumir una dieta baja en grasas totales.  Estos alimentos son altos en grasas no saturadas:   Aceite de canola.  Aceite de girasol.  [place].  Almendras.  [place]  Pias.  Margarinas hechas con los aceites mencionados.  Aceite de Research scientist (physical sciences).  Aceite de oliva.  [place]  Castaas de caj.  [place] de man.  Semillas de girasol.  Aceite de [place] de man.  Aceitunas  [place].  Gertie Exon de castilla.  Semillas de calabaza. Evite el azcar y los alimentos altos en azcar. Esto disminuir los carbohidratos sin disminuir otros nutrientes. El azcar en la sangre va rpidamente al cerebro. Cuando hay un exceso de azcar en la sangre, el hgado podr [place] para producir ms triglicridos. Los azcares tambin contienen caloras con otros nutrientes importantes.  Coma MENOS alimentos de los siguientes:   Location manager, azcar negro, azcar impalpable, jaleas, gelatinas, mermeladas, miel, almbar, melaza, pasteles, dulces, tortas, galletitas, glaseados, pasteles, bebidas cola, gaseosas, refrescos y jugos de frutas y gelatina normal.  Evite el alcohol. El alcohol puede  aumentar los niveles de triglicridos, incluso ms que Glass blower/designer. Adems, el alcohol es alto en caloras y bajo en nutrientes. Pida agua mineral o una gaseosa de bajas caloras en vez de una bebida alcohlica. Sugerencias para planear y preparar alimentos  [place], hornee o ase las carnes en vez de frerlas.  Quite la grasa de la carne y la piel de las aves antes de cocinarlas.  Aada especias, hierbas,  jugo de limn o vinagre a los vegetales en vez de sal, salsas pesadas o salsas de carne.  Utilice una sartn antiadherente sin grasas o use sprays antiadherentes.  Refrigere guisos y caldos. Luego elimine la grasa endurecida que flota en la superficie antes de servir.  Refrigere las grasas de la carne asada y desgrsela para hacer salsas de carne sin grasa.  Coma ms pescado.  Utilice menos [place], margarina y otras grasas en panes o vegetales.  Utilice leche en polvo descremada o reconstituida para cocinar.  Cocine con quesos bajos en grasas.  Sustituya el yogur bajo en grasa o queso cottage en toda o parte de las cremas de las recetas de salsas, bocaditos o ensaladas espesas.  Use mitad yogur y [employer].  Sustituya la crema por leche evaporada descremada. La crema batida puede sustituirse por leche evaporada descremada o reconstituida sin grasas en algunas recetas.  Elija frutas frescas para el postre en vez de alimentos altos en grasas como pasteles o tortas. Las frutas son naturalmente bajas en grasas. Al comer afuera  Pida aperitivos bajos en grasa como jugos de frutas y vegetales, pasta con vegetales o salsa de [place].  Seleccione sopas normales en vez de sopas crema.  Pida que las salsas y condimentos se le sirvan a un costado. Use menos cantidad de ellos.  Pida comidas horneadas, hervidas, a fuego lento, al vapor, fritas en poco aceite o asadas.  Pida margarina en vez de [place], y [place] pequea cantidad.  Beba agua mineral, t o caf sin azcar o gaseosas dietticas en vez de alcohol o bebidas dulces. PREGUNTAS Y RESPUESTAS ACERCA DE OTRAS GRASAS EN LA SANGRE:  GRASAS SATURADAS, [place] TRANS Y COLESTEROL Qu son las grasas trans? Son un tipo de grasa que se forma cuando los aceites vegetales se endurecen a Proofreader de un proceso llamado hidrogenacin. Este proceso ayuda a hacer los alimentos ms slidos, darles forma y Teaching laboratory technician su  conservacin. Las grasas trans tambin se denominan aceites hidrogenados o parcialmente hidrogenados.  Qu tienen que ver las grasas saturadas, las grasas trans y Freight forwarder en los alimentos con las enfermedades cardacas? Las grasas saturadas, las grasas trans y Freight forwarder en la dieta elevan los niveles de colesterol LDL "malo" en la [place]. Cuanto ms elevado sea el nivel de colesterol LDL, ms elevado ser el riesgo de sufrir enfermedad cardaca. Las grasas saturadas y las grasas trans elevan el colesterol de [place] similar.  Qu alimentos contienen grasas saturadas, grasas trans y colesterol? Las [place] cantidades de grasas saturadas se encuentran en productos animales, como cortes grasos de carne, piel de ave y productos lcteos enteros como [place], [place], crema y [place], y en aceites vegetales tropicales como el de [place], de almendra de palma y de [place] grasas trans se encuentran a veces en los mismos alimentos como grasas saturadas, como en la [place] vegetal, Building services engineer (en especial las que son duras o en barra), galletas saladas, galletitas, productos de panadera, comidas fritas, aderezo para ensaladas y otros alimentos procesados hechos con aceites  vegetales parcialmente hidrogenados. Tambin existen naturalmente pequeas cantidades de grasas trans en algunos productos animales, como lcteos, carne de vaca y cordero. Los [employer]s en colesterol incluyen el hgado, otras vsceras, yema de [place], [place], y productos lcteos enteros. Cmo puedo [employer] Corporation las nuevas etiquetas de los alimentos para hacer elecciones saludables? Observe la pirmide nutricional en la etiqueta del producto. Elija alimentos bajos en grasas saturadas, grasas trans y colesterol. Para grasas saturadas y colesterol, tambin podr Risk manager el Pocentaje de Valor Diario (%VD): 5% VD o menos es bajo, y 20% VD o ms es alto. (No hay %VD para las grasas trans.) Utilice el panel de la pirmide  nutricional para elegir alimentos bajos en grasas saturadas y colesterol, y si las grasas saturadas no aparecen, lea los ingredientes y [employer] productos que contengan aderezos de aceites hidrogenados o parcialmente hidrogenados, que tienden a ser ms altos en grasas trans. PUNTOS A RECORDAR:  Converse con el profesional acerca de su enfermedad cardaca y los pasos que debe tomar para reducir factores de riesgo.  Cambie su dieta. Elija alimentos bajos en grasas saturadas, grasas trans y colesterol.  Aada ejercicio a su rutina diaria si an no lo realiza. Participe en actividad fsica moderada, como una caminata enrgica por al menos 30 minutos preferiblemente [employer]. Falta de tiempo? Reparta los 30 minutos en 3 segmentos de 10 minutos durante el da.  Deje de fumar. Si fuma, contctese con el mdico para conversar sobre cmo puede ayudarlo.  No consuma drogas.  Mantenga un peso corporal adecuado.  Mantenga una presin [place].  Contine con el control de grasas en la sangre segn le haya indicado el mdico. Document Released: 07/18/2009 Document Revised: 07/30/2011 [facility] Patient Information 2014 [place], [place].  Ejercicios para perder peso (Exercise to Lose Weight) La actividad fsica y Ardelia Mems dieta saludable ayudan a perder peso. El mdico podr sugerirle ejercicios especficos. IDEAS Y CONSEJOS PARA HACER EJERCICIOS  Elija opciones econmicas que disfrute hacer , como caminar, andar en bicicleta o los vdeos para ejercitarse.  Utilice las Clinical cytogeneticist del ascensor.  Camine durante la hora del almuerzo.  Estacione el auto lejos del lugar de [place] o [place].  Concurra a un gimnasio o tome clases de gimnasia.  Comience con 5  10 minutos de actividad fsica por da. Ejercite hasta 30 minutos, 4 a 6 das por semana.  Utilice zapatos que tengan un buen soporte y ropas cmodas.  Elongue antes y despus de Chief Technology Officer.  Ejercite hasta que aumente la  respiracin y el corazn palpite rpido.  Beba agua extra cuando ejercite.  No haga ejercicio Contractor, sentirse mareado o que le falte mucho el aire. La actividad fsica puede quemar alrededor de 150 caloras.  Correr 20 cuadras en 15 minutos.  Jugar vley durante 45 a 60 minutos.  Limpiar y encerar el auto durante 45 a 60 minutos.  Jugar ftbol americano de toque.  Caminar 25 cuadras en 35 minutos.  Empujar un cochecito 20 cuadras en 30 minutos.  Jugar baloncesto durante 30 minutos.  Rastrillar hojas secas durante 30 minutos.  Andar en bicicleta 80 cuadras en 30 minutos.  Caminar 30 cuadras en 30 minutos.  Bailar durante 30 minutos.  Quitar la nieve con una pala durante 15 minutos.  Nadar vigorosamente durante 20 minutos.  Subir escaleras durante 15 minutos.  Andar en bicicleta 60 cuadras durante 15 minutos.  Arreglar el jardn entre 30 y 61 minutos.  Saltar a la soga durante 15 minutos.  Limpiar  vidrios o pisos durante 45 a 60 minutos. Document Released: 05/04/2010 Document Revised: 04/22/2011 [facility] Patient Information 2014 [place], [place].

## 2013-07-20 ENCOUNTER — Ambulatory Visit: Payer: Self-pay | Admitting: Gynecologic Oncology

## 2013-07-26 ENCOUNTER — Telehealth (INDEPENDENT_AMBULATORY_CARE_PROVIDER_SITE_OTHER): Payer: Self-pay | Admitting: Gynecologic Oncology

## 2013-07-26 NOTE — Telephone Encounter (Signed)
GYNECOLOGIC ONCOLOGY OFFICE VISIT   Diane King 48 y.o. female  CC:  Chief Complaint  Patient presents with  . Abstract    CHIEF COMPLAINT: Surveillance for stage IB cervical cancer.   HISTORY OF PRESENT ILLNESS: This is a 48 year old who presented with  evidence of a stage IB cervical carcinoma. 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  INTERVAL NOTE  Diane King is doing well.  Is using herbal life and he feels as if she has more energy and overall feels much better.  Pap test collected in November of 2014 was ASC hrHPV+ SOCIAL HISTORY:  History   Social History  . Marital Status: Single    Spouse Name: N/A    Number of Children: N/A  . Years of Education: N/A   Occupational History  .      widowed   Social History Main Topics  . Smoking status: Never Smoker   . Smokeless tobacco: Not on file  . Alcohol Use: No  . Drug Use: No  . Sexual Activity: Yes   Other Topics Concern  . Not on file   Social History Narrative  . No narrative on file    PAST SURGICAL HISTORY  Past Surgical History  Procedure Laterality Date  . Bilateral salpingoophorectomy  2011    Bilateral pelvic lymph node dissection    PAST MEDICAL HISTORY  Past Medical History  Diagnosis Date  . Cervical cancer 07-27-2009 CLINICAL IB1    POORLY DIFFERENTIATED SQUAMOUS CELL CARCINOMA  . Anemia     MILD MICROCYTIC ANEMIA  . History of chemotherapy 11/15/09    Cisplatin 4 cycles weekly completed 11/15/09  . Hx of radiation therapy 10/10/09-11/14/09    cervical   . Hx of radiation therapy 12/14/09-12/17/09    cesium implant intracavity    FAMILY HISTORY  Family History  Problem Relation Age of Onset  .  Breast cancer Sister     REVIEW OF SYSTEMS Constitutional  Feels well,  Cardiovascular  No chest pain, shortness of breath, or edema  Pulmonary  No cough or wheeze.  Gastro Intestinal  No nausea, vomitting, or diarrhoea. No bright red blood per rectum, no abdominal pain, change in bowel movement, or constipation.  Genito Urinary  No frequency, urgency, dysuria, no bleeding or discharge.  Musculo Skeletal  No myalgia, arthralgia, joint swelling or pain  Neurologic  No weakness, numbness, change in gait,  Psychology  No depression, anxiety, insomnia.     Vitals:  Blood pressure 102/66, pulse 86, temperature 99.2 F (37.3 C), temperature source Oral, resp. rate 16.  PHYSICAL EXAMINATION WD female in NAD Neck  Supple without any enlargements.  Lymph node survey. No cervical supraclavicular cervical or inguinal adenopathy Cardiovascular  Pulse normal rate, regularity and rhythm. Lungs  Clear to auscultation bilateraly,  Psychiatry  Alert and oriented to person, place, and time  Back No CVA tenderness Abdomen  Normoactive bowel sounds, abdomen soft, non-tender and obese.  Midline surgical incision intact without evidence of hernia.  Genito Urinary  Vulva/vagina: Normal external female genitalia.  No lesions.   Bladder/urethra:  No lesions or masses  Cervix The cervix is flush with the vaginal vault, no lesions visualized. The vagina is atrophic no parametrial nodularity Rectal  Good tone, no  masses no cul de sac nodularity.  Extremities  No bilateral cyanosis, clubbing or edema.    Assessment:  This is a 48 y.o. year old stage IB cervical carcinoma treated with definitive chemoradiation.  The last 2 Pap tests in November 2012 in May 2013 demonstrated atypical squamous cells colposcopy with biopsy in May of 2013 is not diagnostic of recurrent disease. Pap 12/2011  06/2012 wnl 12/2012 ASC HPV +  Colposcopy performed  Plan Follow-up with Dr. Sondra Come  Follow up with  GYN oncology

## 2013-07-27 ENCOUNTER — Ambulatory Visit: Payer: Self-pay | Admitting: Gynecologic Oncology

## 2013-07-27 ENCOUNTER — Encounter: Payer: Self-pay | Admitting: Gynecologic Oncology

## 2013-07-27 ENCOUNTER — Ambulatory Visit: Payer: Self-pay | Attending: Gynecologic Oncology | Admitting: Gynecologic Oncology

## 2013-07-27 VITALS — BP 112/73 | HR 84 | Temp 98.6°F | Resp 20 | Ht 61.0 in | Wt 170.3 lb

## 2013-07-27 DIAGNOSIS — Z9119 Patient's noncompliance with other medical treatment and regimen: Secondary | ICD-10-CM | POA: Insufficient documentation

## 2013-07-27 DIAGNOSIS — E785 Hyperlipidemia, unspecified: Secondary | ICD-10-CM | POA: Insufficient documentation

## 2013-07-27 DIAGNOSIS — Z8541 Personal history of malignant neoplasm of cervix uteri: Secondary | ICD-10-CM

## 2013-07-27 DIAGNOSIS — Z923 Personal history of irradiation: Secondary | ICD-10-CM | POA: Insufficient documentation

## 2013-07-27 DIAGNOSIS — B977 Papillomavirus as the cause of diseases classified elsewhere: Secondary | ICD-10-CM

## 2013-07-27 DIAGNOSIS — Z9221 Personal history of antineoplastic chemotherapy: Secondary | ICD-10-CM | POA: Insufficient documentation

## 2013-07-27 DIAGNOSIS — Z91199 Patient's noncompliance with other medical treatment and regimen due to unspecified reason: Secondary | ICD-10-CM | POA: Insufficient documentation

## 2013-07-27 DIAGNOSIS — C539 Malignant neoplasm of cervix uteri, unspecified: Secondary | ICD-10-CM

## 2013-07-27 NOTE — Progress Notes (Signed)
GYNECOLOGIC ONCOLOGY OFFICE VISIT   Diane King 48 y.o. female  CC:  Chief Complaint  Patient presents with  . Cervcal ca    Follow up     CHIEF COMPLAINT: Surveillance for stage IB cervical cancer.   HISTORY OF PRESENT ILLNESS: This is a 48 y.o. 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  INTERVAL NOTE  Diane King is doing well.  Is using herbal life and he feels as if she has more energy and overall feels much better.  Pap test collected in November of 2014 was ASC hrHPV+ SOCIAL HISTORY:  History   Social History  . Marital Status: Single    Spouse Name: N/A    Number of Children: N/A  . Years of Education: N/A   Occupational History  .      widowed   Social History Main Topics  . Smoking status: Never Smoker   . Smokeless tobacco: Not on file  . Alcohol Use: No  . Drug Use: No     Comment: Patient info given.  Pt. denies smoking   . Sexual Activity: Yes   Other Topics Concern  . Not on file   Social History Narrative  . No narrative on file  Is not employed  PAST SURGICAL HISTORY  Past Surgical History  Procedure Laterality Date  . Bilateral salpingoophorectomy  2011    Bilateral pelvic lymph node dissection    PAST MEDICAL HISTORY  Past Medical History  Diagnosis Date  . Cervical cancer 07-27-2009 CLINICAL IB1    POORLY DIFFERENTIATED SQUAMOUS CELL CARCINOMA  . Anemia     MILD MICROCYTIC ANEMIA  . History of chemotherapy 11/15/09    Cisplatin 4 cycles weekly completed 11/15/09  . Hx of radiation therapy 10/10/09-11/14/09    cervical   . Hx of radiation therapy 12/14/09-12/17/09    cesium implant  intracavity    FAMILY HISTORY  Family History  Problem Relation Age of Onset  . Breast cancer Sister     REVIEW OF SYSTEMS Constitutional  Feels well,  Cardiovascular  No chest pain, shortness of breath, or edema  Pulmonary  Occasional  cough  Productive of white/yellow sputum. No wheeze.  Gastro Intestinal  No nausea, vomitting, or diarrhoea. No bright red blood per rectum, no abdominal pain, change in bowel movement, or constipation.  Genito Urinary  No frequency, urgency, dysuria, no bleeding or discharge.  Musculo Skeletal  No myalgia, arthralgia, joint swelling or pain  Neurologic  No weakness, numbness, change in gait,  Psychology  No depression, anxiety, insomnia.     Vitals:  Blood pressure 112/73, pulse 84, temperature 98.6 F (37 C), temperature source Oral, resp. rate 20, height 5\' 1"  (1.549 m), weight 170 lb 4.8 oz (77.248 kg). Wt Readings from Last 3 Encounters:  07/27/13 170 lb 4.8 oz (77.248 kg)  07/16/13 175 lb (79.379 kg)  07/02/13 175 lb 12.8 oz (79.742 kg)    PHYSICAL EXAMINATION WD female in NAD Neck  Supple without any enlargements. No maxillary or frontal sinus discomfort with pressure. Lymph node survey. No cervical supraclavicular cervical or inguinal adenopathy Cardiovascular  Pulse normal rate, regularity and rhythm. Lungs  Clear to auscultation bilateraly,  Psychiatry  Alert and oriented appropriate mood and affect Back No CVA tenderness Abdomen  Normoactive bowel sounds, abdomen soft, non-tender and obese.  Midline surgical incision intact without evidence of hernia.  Genito Urinary  Vulva/vagina: Normal external female genitalia.  No lesions.   Bladder/urethra:  No lesions or masses  Cervix The cervix is flush with the vaginal vault,  Ascetic acid applied to the cervix and vagina.  No lesions visualized with red/green fiter.. SCJ retracted into the endocervical canal.  Satisfactory colposcopy.  No lesions identified.  ECC collected,  random scat biopsy at 9 0'clock.The vagina is atrophic no parametrial nodularity Rectal  Good tone, no masses no cul de sac nodularity.  Extremities  No bilateral cyanosis, clubbing or edema.    Assessment:  This is a 48 y.o. year old stage IB cervical carcinoma treated with definitive chemoradiation.  The last 2 Pap tests in November 2012 in May 2013 demonstrated atypical squamous cells colposcopy with biopsy in May of 2013 is not diagnostic of recurrent disease. Pap 12/2011  06/2012 wnl 12/2012 ASC HPV +  Colposcopy performed  Plan Follow-up with Dr. Sondra Come  In 6 months Follow up with GYN oncology in 12 months Will contact patient with the colposcopy results.  If negative will require a repeat pap in 12 months   Hyperlipidemia:  Patient has been non compliant with meds because she wants to try other alternatives.  She was advised to use the medication as discussed with her provider and to have a discussion regardig alternative measures.

## 2013-07-27 NOTE — Patient Instructions (Addendum)
Follow-up with Dr. Sondra Come in 6 months Follow-up with Gyn Onc in 12 months  Will call with biopsy results and plan as indicated.  Results for NEL, STONEKING (MRN [id number]) as of 07/27/2013 12:26  Ref. Range 07/02/2013 09:34  Mean Plasma Glucose Latest Range: <117 mg/dL 123 (H)  Cholesterol Latest Range: 0-200 mg/dL 215 (H)  Triglycerides Latest Range: <150 mg/dL 317 (H)  HDL Latest Range: >39 mg/dL 40  LDL (calc) Latest Range: 0-99 mg/dL 112 (H)  VLDL Latest Range: 0-40 mg/dL 63 (H)  Total CHOL/HDL Ratio No range found 5.4  Hemoglobin A1C Latest Range: <5.7 % 5.9 (H)  Advised to take medicine as RX.  Colesterol, colesterol en sangre (Cholesterol, Blood Cholesterol) El colesterol es una protena cerosa parecida a la grasa que es indispensable para la vida. Interviene en la formacin de las membranas celulares de todos los rganos y tejidos del cuerpo. Se [place] para fabricar las hormonas necesarias para el crecimiento y la reproduccin. Produce los cidos biliares necesarios para absorber los nutrientes de los alimentos. Por la sangre, circula una pequea cantidad del colesterol del cuerpo en forma de partculas llamadas lipoprotenas. El hgado fabrica todo el colesterol que su organismo [place], y Ardelia Mems cantidad extra proviene de los alimentos que come. El colesterol extra puede depositarse en las paredes de los vasos sanguneos. Este anlisis es diferente de la mayora de los anlisis ya que no se [place] para diagnosticar una enfermedad, sino para hacer una estimacin del riesgo de Actor enfermedades cardacas. Puesto que se ha asociado al colesterol con el endurecimiento de las arterias, las enfermedades cardacas y los ataques al corazn, el anlisis de colesterol se considera una parte rutinaria de la atencin mdica preventiva. PREPARACIN PARA EL ESTUDIO Se toma una [place] de una vena del brazo. No debe comer ni beber nada durante las doce (12) horas previas a la extraccin  de Herbalist. No es necesario hacer ayuno cuando se hace un anlisis de colesterol casero ya que este solo determina el colesterol total. QU SIGNIFICAN LOS RESULTADOS:  El colesterol total es una medida general de todo el colesterol en [place].  El LDL es el que se conoce como colesterol malo y es el que se deposita en las paredes de las arterias. Su concentracin debe ser baja.  El HDL es el colesterol bueno porque limpia las arterias y arrastra el LDL. Su concentracin debe ser alta.  Los triglicridos son grasas que el cuerpo puede quemar ya sea para energa o para almacenamiento. Las concentraciones altas estn estrechamente vinculadas con las enfermedades cardacas. CONCENTRACIONES DESEADAS:  El colesterol total por debajo de 200.  El LDL por debajo de 100 en las personas en riesgo y por debajo de 8 en aquellas que corren un riesgo muy alto.  El HDL por [place] de 50es bueno y por [place] de 60 es lo mejor.  Los triglicridos por debajo de 150. Los rangos para los resultados normales pueden variar entre diferentes laboratorios y hospitales. Consulte siempre con su mdico despus de Psychologist, counselling estudio para Armed forces logistics/support/administrative officer significado de los [place] y si los valores se consideran "dentro de los lmites normales". SIGNIFICADO DEL ESTUDIO  El mdico leer los resultados y Electrical engineer con usted sobre la importancia y el significado de los [place], as como las opciones de tratamiento y la necesidad de Optometrist pruebas adicionales, si fuera necesario. OBTENCIN DE LOS RESULTADOS DE LAS PRUEBAS Es su responsabilidad retirar el resultado del [place]. Consulte en el laboratorio cundo y cmo  podr [employer]. Document Released: 11/18/2012 [facility] Patient Information 2014 [place], [place].   Thank you very much Ms. Bently Morath for allowing me to provide care for you today.  I appreciate your confidence in choosing our Gynecologic Oncology team.  If you have any questions about  your visit today please call our office and we will get back to you as soon as possible.  Francetta Found. Dannilynn Gallina MD., PhD Gynecologic Oncology

## 2013-07-30 ENCOUNTER — Telehealth: Payer: Self-pay | Admitting: *Deleted

## 2013-07-30 NOTE — Telephone Encounter (Signed)
Unable to reach pt, voice mail "full" letter sent to pt with her results

## 2013-08-04 ENCOUNTER — Encounter: Payer: Self-pay | Admitting: Family Medicine

## 2013-08-18 ENCOUNTER — Encounter: Payer: Self-pay | Admitting: Family Medicine

## 2013-10-07 ENCOUNTER — Telehealth: Payer: Self-pay

## 2013-10-07 NOTE — Telephone Encounter (Addendum)
Interpreter called patient to see how was doing. Patient stated that was feeing better. Patient stated that her weight was down to 159 from 176. She was started on Lopid 600 mg at the doctor's office twice a day. Patient stated that she was doing good with medication.  Plan: Bring patient in for face to face appointment for further teaching and health coaching.

## 2013-11-15 ENCOUNTER — Ambulatory Visit: Payer: Self-pay

## 2013-12-13 ENCOUNTER — Encounter: Payer: Self-pay | Admitting: Gynecologic Oncology

## 2013-12-20 ENCOUNTER — Ambulatory Visit: Payer: Self-pay | Admitting: Radiation Oncology

## 2013-12-23 ENCOUNTER — Ambulatory Visit: Payer: Self-pay | Admitting: Radiation Oncology

## 2014-01-17 ENCOUNTER — Encounter: Payer: Self-pay | Admitting: Radiation Oncology

## 2014-01-17 ENCOUNTER — Ambulatory Visit: Payer: Self-pay | Admitting: Radiation Oncology

## 2014-01-17 ENCOUNTER — Other Ambulatory Visit (HOSPITAL_COMMUNITY)
Admission: RE | Admit: 2014-01-17 | Discharge: 2014-01-17 | Disposition: A | Discharge status: Home / Self Care | Payer: Self-pay | Source: Ambulatory Visit | Attending: Radiation Oncology | Admitting: Radiation Oncology

## 2014-01-17 ENCOUNTER — Ambulatory Visit
Admission: RE | Admit: 2014-01-17 | Discharge: 2014-01-17 | Disposition: A | Discharge status: Home / Self Care | Payer: Self-pay | Source: Ambulatory Visit | Attending: Radiation Oncology | Admitting: Radiation Oncology

## 2014-01-17 VITALS — BP 110/76 | HR 62 | Temp 97.8°F | Resp 16 | Ht 61.0 in | Wt 146.5 lb

## 2014-01-17 DIAGNOSIS — R87810 Cervical high risk human papillomavirus (HPV) DNA test positive: Secondary | ICD-10-CM | POA: Insufficient documentation

## 2014-01-17 DIAGNOSIS — Z01411 Encounter for gynecological examination (general) (routine) with abnormal findings: Secondary | ICD-10-CM | POA: Insufficient documentation

## 2014-01-17 DIAGNOSIS — C539 Malignant neoplasm of cervix uteri, unspecified: Secondary | ICD-10-CM

## 2014-01-17 DIAGNOSIS — Z1151 Encounter for screening for human papillomavirus (HPV): Secondary | ICD-10-CM | POA: Insufficient documentation

## 2014-01-17 NOTE — Progress Notes (Signed)
  Radiation Oncology         [phone] ________________________________  Name: Diane King MRN: [id number]  Date: 01/17/2014  DOB: 10/06/1965  Follow-Up Visit Note  CC: No PCP Per Patient  Dierdre Forth, MD    ICD-9-CM ICD-10-CM   1. Cervical cancer 180.9 C53.9 Cytology - PAP    Diagnosis:   Stage IB cervical cancer  Interval Since Last Radiation:  5 years, the patient completed external beam radiation therapy as well as a cesium 137 implant   Narrative:  The patient returns today for routine follow-up.  She is doing well at this time. She denies any pain within the pelvis area vaginal bleeding hematuria or rectal bleeding. She has lost approximately 34 pounds by changing her diet. 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  Meds: No current outpatient prescriptions on file.   No current facility-administered medications for this encounter.    Physical Findings: The patient is in no acute distress. Patient is alert and oriented. Accompanied by an interpreter  height is 5\' 1"  (1.549 m) and weight is 146 lb 8 oz (66.452 kg). Her oral temperature is 97.8 F (36.6 C). Her blood pressure is 110/76 and her pulse is 62. Her respiration is 16. Marland Kitchen  No palpable subclavicular or axillary adenopathy. Lungs clear to auscultation. The heart has regular rhythm and rate. The abdomen is soft and nontender with normal bowel sounds. No inguinal adenopathy appreciated. On pelvic examination the external genitalia are unremarkable. Speculum exam is performed. There are no mucosal lesions noted in the vaginal vault. The cervical os identified. A Pap smear was obtained of the cervical region on bimanual and rectovaginal examination there no pelvic masses appreciated. Lab Findings: Lab Results  Component Value Date   WBC  3.0* 06/21/2010   HGB 11.5* 06/21/2010   HCT 34.5* 06/21/2010   MCV 88.6 06/21/2010   PLT 199 06/21/2010    Radiographic Findings: No results found.  Impression: no evidence for consult exam today, Pap smear pending  Plan:  Routine followup in one year. In the interim the patient will be seen by gynecologic oncology approximately 6 months from now.  ____________________________________ Blair Promise, MD

## 2014-01-17 NOTE — Progress Notes (Signed)
[name] here for follow up after treatment for cervical cancer.  She denies pain except for pain on the top of her left foot.  She also denies bladder/bowel issues, rectal/vaginal bleeding, nausea or fatigue.  She has lost 34 lbs since June by changing her diet.

## 2014-01-19 LAB — CYTOLOGY - PAP

## 2014-02-01 ENCOUNTER — Telehealth: Payer: Self-pay | Admitting: Oncology

## 2014-02-01 ENCOUNTER — Telehealth: Payer: Self-pay | Admitting: *Deleted

## 2014-02-01 DIAGNOSIS — C539 Malignant neoplasm of cervix uteri, unspecified: Secondary | ICD-10-CM

## 2014-02-01 MED ORDER — ESTROGENS, CONJUGATED 0.625 MG/GM VA CREA: 1.0000 | TOPICAL_CREAM | VAGINAL | Status: DC

## 2014-02-01 NOTE — Telephone Encounter (Signed)
Mcarthur Rossetti, interpreter, to let her know pt has an appointment with Dr. Denman George on 02/18/14 at 2:30pm. Told her to please call us back if patient cannot make that appointment and we can reschedule her to another day. Also called [phone] and they placed pt on [name]'s schedule for 02/18/14.

## 2014-02-01 NOTE — Telephone Encounter (Signed)
Called [name] - Interpreter to call [place] with her abnormal pap smear results per Dr. Sondra Come.  [name] said she had called her and also told her of her appointment with Dr. Denman George on 02/18/14.

## 2014-02-18 ENCOUNTER — Encounter: Payer: Self-pay | Admitting: Gynecologic Oncology

## 2014-02-18 ENCOUNTER — Ambulatory Visit: Payer: Self-pay | Attending: Gynecologic Oncology | Admitting: Gynecologic Oncology

## 2014-02-18 VITALS — BP 107/62 | HR 72 | Temp 98.1°F | Resp 16 | Ht 61.0 in | Wt 142.9 lb

## 2014-02-18 DIAGNOSIS — R87610 Atypical squamous cells of undetermined significance on cytologic smear of cervix (ASC-US): Secondary | ICD-10-CM | POA: Insufficient documentation

## 2014-02-18 DIAGNOSIS — C539 Malignant neoplasm of cervix uteri, unspecified: Secondary | ICD-10-CM | POA: Insufficient documentation

## 2014-02-18 DIAGNOSIS — Z9221 Personal history of antineoplastic chemotherapy: Secondary | ICD-10-CM | POA: Insufficient documentation

## 2014-02-18 DIAGNOSIS — Z923 Personal history of irradiation: Secondary | ICD-10-CM | POA: Insufficient documentation

## 2014-02-18 DIAGNOSIS — IMO0002 Reserved for concepts with insufficient information to code with codable children: Secondary | ICD-10-CM | POA: Insufficient documentation

## 2014-02-18 DIAGNOSIS — Z8541 Personal history of malignant neoplasm of cervix uteri: Secondary | ICD-10-CM

## 2014-02-18 DIAGNOSIS — C7989 Secondary malignant neoplasm of other specified sites: Secondary | ICD-10-CM | POA: Insufficient documentation

## 2014-02-18 DIAGNOSIS — R896 Abnormal cytological findings in specimens from other organs, systems and tissues: Secondary | ICD-10-CM

## 2014-02-18 DIAGNOSIS — R87810 Cervical high risk human papillomavirus (HPV) DNA test positive: Secondary | ICD-10-CM | POA: Insufficient documentation

## 2014-02-18 NOTE — Patient Instructions (Signed)
Dr. Denman George will see you in June 2016. If any further treatment is needed after today we will give you a call sooner.

## 2014-02-18 NOTE — Progress Notes (Signed)
GYNECOLOGIC ONCOLOGY OFFICE VISIT   Diane King 49 y.o. female  CC:  Chief Complaint  Patient presents with  . Cervical Cancer  ASCUS pap on surveillance exam.  CHIEF COMPLAINT: Surveillance for stage IB cervical cancer.   HISTORY OF PRESENT ILLNESS: This is a 49 y.o. 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  INTERVAL NOTE  Diane King is doing well.  Is using herbal life and he feels as if she has more energy and overall feels much better.  Pap test collected in November of 2014 was ASC hrHPV+ Pap test collected by Dr Sondra Come in 12/15 revealed ASCUS, hrHPV positive. She reports light vaginal spotting since January 2016 which she thought might be an infection.  SOCIAL HISTORY:  History   Social History  . Marital Status: Single    Spouse Name: N/A    Number of Children: N/A  . Years of Education: N/A   Occupational History  .      widowed   Social History Main Topics  . Smoking status: Never Smoker   . Smokeless tobacco: Not on file  . Alcohol Use: No  . Drug Use: No     Comment: Patient info given.  Pt. denies smoking   . Sexual Activity: Yes   Other Topics Concern  . Not on file   Social History Narrative  Is not employed  PAST SURGICAL HISTORY  Past Surgical History  Procedure Laterality Date  . Bilateral salpingoophorectomy  2011    Bilateral pelvic lymph node dissection    PAST MEDICAL HISTORY  Past Medical History  Diagnosis Date  . Cervical cancer 07-27-2009 CLINICAL IB1    POORLY DIFFERENTIATED SQUAMOUS CELL CARCINOMA  . Anemia     MILD MICROCYTIC ANEMIA  . History of chemotherapy 11/15/09   Cisplatin 4 cycles weekly completed 11/15/09  . Hx of radiation therapy 10/10/09-11/14/09    cervical   . Hx of radiation therapy 12/14/09-12/17/09    cesium implant intracavity    FAMILY HISTORY  Family History  Problem Relation Age of Onset  . Breast cancer Sister     REVIEW OF SYSTEMS Constitutional  Feels well,  Cardiovascular  No chest pain, shortness of breath, or edema  Pulmonary  Occasional  cough  Productive of white/yellow sputum. No wheeze.  Gastro Intestinal  No nausea, vomitting, or diarrhoea. No bright red blood per rectum, no abdominal pain, change in bowel movement, or constipation.  Genito Urinary  No frequency, urgency, dysuria, + bleeding or discharge.  Musculo Skeletal  No myalgia, arthralgia, joint swelling or pain  Neurologic  No weakness, numbness, change in gait,  Psychology  No depression, anxiety, insomnia.     Vitals:  Blood pressure 107/62, pulse 72, temperature 98.1 F (36.7 C), temperature source Oral, resp. rate 16, height 5\' 1"  (1.549 m), weight 142 lb 14.4 oz (64.819 kg). Wt Readings from Last 3 Encounters:  02/18/14 142 lb 14.4 oz (64.819 kg)  01/17/14 146 lb 8 oz (66.452 kg)  07/27/13 170 lb 4.8 oz (77.248 kg)    PHYSICAL EXAMINATION WD female in NAD Neck  Supple without any enlargements. No maxillary or frontal sinus discomfort with pressure. Lymph node survey. No cervical supraclavicular  cervical or inguinal adenopathy Cardiovascular  Pulse normal rate, regularity and rhythm. Lungs  Clear to auscultation bilateraly,  Psychiatry  Alert and oriented appropriate mood and affect Back No CVA tenderness Abdomen  Normoactive bowel sounds, abdomen soft, non-tender and obese.  Midline surgical incision intact without evidence of hernia.  Genito Urinary  Vulva/vagina: Normal external female genitalia.  No lesions.   Bladder/urethra:  No lesions or masses  Cervix The cervix is flush with the vaginal vault,   COLPSCOPY:  Ascetic acid  applied to the cervix and vagina.  No lesions visualized with red/green fiter.. SCJ retracted into the endocervical canal. Petechiae noted and prominent vasculature at 2 oclock of the vaginal/cervical junction. Satisfactory colposcopy.  Biopsy taken at this position. Rectal  Good tone, no masses no cul de sac nodularity.  Extremities  No bilateral cyanosis, clubbing or edema.    Assessment:  This is a 49 y.o. year old stage IB cervical carcinoma treated with definitive chemoradiation.  Multiple paps during surveillance demonstrated atypical squamous cells. 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  Colposcopy performed. No apparent recurrence on visual inspection.   Plan Follow-up with Dr. Sondra Come  In 12 months Follow up with GYN oncology in 6 months Will contact patient with the colposcopy results.  If negative will require a repeat pap in 12 months  Donaciano Eva, MD .

## 2014-02-22 ENCOUNTER — Ambulatory Visit: Payer: Self-pay | Admitting: Radiation Oncology

## 2014-05-04 ENCOUNTER — Ambulatory Visit: Payer: Self-pay

## 2014-07-01 ENCOUNTER — Other Ambulatory Visit: Payer: Self-pay | Admitting: Obstetrics and Gynecology

## 2014-07-01 DIAGNOSIS — Z1231 Encounter for screening mammogram for malignant neoplasm of breast: Secondary | ICD-10-CM

## 2014-07-14 ENCOUNTER — Ambulatory Visit (HOSPITAL_COMMUNITY): Payer: Self-pay

## 2014-08-04 ENCOUNTER — Ambulatory Visit (HOSPITAL_COMMUNITY)
Admission: RE | Admit: 2014-08-04 | Discharge: 2014-08-04 | Disposition: A | Discharge status: Home / Self Care | Payer: Self-pay | Source: Ambulatory Visit | Attending: Obstetrics and Gynecology | Admitting: Obstetrics and Gynecology

## 2014-08-04 ENCOUNTER — Encounter (HOSPITAL_COMMUNITY): Payer: Self-pay

## 2014-08-04 VITALS — BP 104/68 | Temp 98.1°F | Ht 62.0 in | Wt 150.0 lb

## 2014-08-04 DIAGNOSIS — Z1239 Encounter for other screening for malignant neoplasm of breast: Secondary | ICD-10-CM

## 2014-08-04 DIAGNOSIS — Z1231 Encounter for screening mammogram for malignant neoplasm of breast: Secondary | ICD-10-CM

## 2014-08-04 NOTE — Progress Notes (Signed)
CLINIC:  [facility] Passenger transport manager) Clinic  REASON FOR VISIT: Well-woman exam and screening mammogram.    HISTORY OF PRESENT ILLNESS:  [name] is a 49 y.o. female who presents to the [facility] today for clinical breast exam. She has a personal history of cervical history.  She has a family history of breast cancer in her sister.  Her last pap smear was in 01/2014 and showed ASC-US and HPV (+).  She is followed by Dr. Skeet Latch at [facility].  She has no complaints today.    REVIEW OF SYSTEMS:  Denies any breast pain, nodularity, nipple inversion, or nipple discharge bilaterally.    ALLERGIES: No Known Allergies  CURRENT MEDICATIONS:  Current Outpatient Prescriptions on File Prior to Encounter  Medication Sig Dispense Refill  . Multiple Vitamins-Minerals (MULTIVITAL PO) Take 1 tablet by mouth daily.     No current facility-administered medications on file prior to encounter.     PHYSICAL EXAM:  Vitals:  Filed Vitals:   08/04/14 1405  BP: 104/68  Temp: 98.1 F (36.7 C)   General: Well-nourished, well-appearing female in no acute distress.  She is unaccompanied in clinic today.  Rolena Infante, LPN and Tamsen Snider, Spanish language interpreter were present during physical exam for this patient.  Breasts: Bilateral breasts exposed and observed with patient standing (arms at side, arms on hips, arms on hips flexed forward, and arms over head).  No gross abnormalities including breast skin puckering or dimpling noted on observation.  Breasts symmetrical without evidence of skin redness, thickening, or peau d'orange appearance. Left nipple retraction noted.  Per patient, this is normal for her.  No nipple discharge bilaterally. No breast nodularity palpated in bilateral breasts.  Normal fibrocystic breast changes noted in bilateral breasts. Axillary lymph nodes: No axillary lymphadenopathy bilaterally.   GU: Exam deferred. Pap smear is  up-to-date.  ASSESSMENT & PLAN:   1. Breast cancer screening: [name] has no palpable breast abnormalities on her clinical breast exam today.  She has left nipple inversion, which she says is normal for her.  She will receive her screening mammogram as scheduled.  She will be contacted by the imaging center for results of the mammogram, either by letter or phone within the next few weeks.  She was given instructions and educational materials regarding breast self-awareness. [name] is aware of this plan and agrees with it.    [name] was encouraged to ask questions and all questions were answered to her satisfaction.    Mike Craze, NP [place]  [phone]

## 2014-08-08 ENCOUNTER — Encounter: Payer: Self-pay | Admitting: Gynecologic Oncology

## 2014-08-08 ENCOUNTER — Ambulatory Visit: Payer: Self-pay | Attending: Gynecologic Oncology | Admitting: Gynecologic Oncology

## 2014-08-08 VITALS — BP 94/68 | HR 67 | Temp 98.5°F | Resp 18 | Ht 62.0 in | Wt 148.6 lb

## 2014-08-08 DIAGNOSIS — C539 Malignant neoplasm of cervix uteri, unspecified: Secondary | ICD-10-CM | POA: Insufficient documentation

## 2014-08-08 NOTE — Progress Notes (Signed)
GYNECOLOGIC ONCOLOGY OFFICE VISIT   Diane King 49 y.o. female  CC:  Hx of cervical cancer The patient was seen and counseled with an interpretor.  CHIEF COMPLAINT: Surveillance for stage IB cervical cancer.   HISTORY OF PRESENT ILLNESS: This is a 49 y.o. 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  INTERVAL NOTE  Diane King is doing well.  Is using herbal life and he feels as if she has more energy and overall feels much better.  Pap test collected in November of 2014 was ASC hrHPV+ Pap test collected by Dr Sondra Come in 12/15 revealed ASCUS, hrHPV positive. The followup colposcopy and biopsy revealed atrophic tissue, no dysplasia or recurrent cancer. She is doing well with no specific complaints. She is sexually active. Her sister has just been diagnosed with metastatic breast cancer. She is noting bilateral groin discomfort worse with doing stairs.  SOCIAL HISTORY:  History   Social History  . Marital Status: Single    Spouse Name: N/A  . Number of Children: N/A  . Years of Education: N/A   Occupational History  .      widowed   Social History Main Topics  . Smoking status: Never Smoker   . Smokeless tobacco: Not on file  . Alcohol Use: No  . Drug Use: No     Comment: Patient info given.  Pt. denies smoking   . Sexual Activity: Yes   Other Topics Concern  . Not on file   Social History Narrative  Is not employed  PAST SURGICAL HISTORY  Past Surgical History  Procedure Laterality Date  . Bilateral salpingoophorectomy  2011    Bilateral pelvic lymph node dissection    PAST MEDICAL HISTORY  Past Medical History   Diagnosis Date  . Cervical cancer 07-27-2009 CLINICAL IB1    POORLY DIFFERENTIATED SQUAMOUS CELL CARCINOMA  . Anemia     MILD MICROCYTIC ANEMIA  . History of chemotherapy 11/15/09    Cisplatin 4 cycles weekly completed 11/15/09  . Hx of radiation therapy 10/10/09-11/14/09    cervical   . Hx of radiation therapy 12/14/09-12/17/09    cesium implant intracavity    FAMILY HISTORY  Family History  Problem Relation Age of Onset  . Breast cancer Sister     REVIEW OF SYSTEMS Constitutional  Feels well,  Cardiovascular  No chest pain, shortness of breath, or edema  Pulmonary  Occasional  cough  Productive of white/yellow sputum. No wheeze.  Gastro Intestinal  No nausea, vomitting, or diarrhoea. No bright red blood per rectum, no abdominal pain, change in bowel movement, or constipation.  Genito Urinary  No frequency, urgency, dysuria, + bleeding or discharge.  Musculo Skeletal  No myalgia, arthralgia, joint swelling or pain  Neurologic  No weakness, numbness, change in gait,  Psychology  No depression, anxiety, insomnia.     Vitals:  Blood pressure 94/68, pulse 67, temperature 98.5 F (36.9 C), temperature source Oral, resp. rate 18, height 5\' 2"  (1.575 m), weight 148 lb 9.6 oz (67.405 kg), SpO2 100 %. Wt Readings from Last 3 Encounters:  08/08/14 148 lb 9.6 oz (67.405 kg)  08/04/14 150 lb (68.04 kg)  02/18/14 142 lb 14.4 oz (64.819 kg)  PHYSICAL EXAMINATION WD female in NAD Neck  Supple without any enlargements. No maxillary or frontal sinus discomfort with pressure. Lymph node survey. No cervical supraclavicular cervical or inguinal adenopathy Cardiovascular  Pulse normal rate, regularity and rhythm. Lungs  Clear to auscultation bilateraly,  Psychiatry  Alert and oriented appropriate mood and affect Back No CVA tenderness Abdomen  Normoactive bowel sounds, abdomen soft, non-tender and obese.  Midline surgical incision intact without evidence of hernia.  Genito  Urinary  Vulva/vagina: Normal external female genitalia.  No lesions.   Bladder/urethra:  No lesions or masses  Cervix The cervix is flush with the vaginal vault, no lesions or irregularities. No RV septal nodularity or parametrial induration.     Assessment:  This is a 49 y.o. year old stage IB cervical carcinoma treated with definitive chemoradiation.  Multiple paps during surveillance demonstrated atypical squamous cells. Colpo and biopsies negative. 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 No suspicious inguinal nodes.  Plan Follow-up with Dr. Sondra Come  In 6 months Follow up with GYN oncology in 12 months  Diane Eva, MD .

## 2014-08-08 NOTE — Patient Instructions (Signed)
Plan to see Dr. Sondra Come in six months and Dr. Denman George in one year or sooner if needed.

## 2015-01-20 ENCOUNTER — Telehealth: Payer: Self-pay | Admitting: Oncology

## 2015-01-20 ENCOUNTER — Telehealth: Payer: Self-pay | Admitting: *Deleted

## 2015-01-20 NOTE — Telephone Encounter (Signed)
Used [employer] to call patient to move appt.  No voice mail set up.  Contact person, Elita Quick was called.  Number is now assigned to someone else.

## 2015-01-20 NOTE — Telephone Encounter (Addendum)
Called patient multiple times today to try to move follow up appointment with Dr. Sondra Come on Monday, 01/23/15.  She did not answer and voice mail was full.

## 2015-01-23 ENCOUNTER — Telehealth: Payer: Self-pay | Admitting: *Deleted

## 2015-01-23 ENCOUNTER — Ambulatory Visit: Admission: RE | Admit: 2015-01-23 | Payer: Self-pay | Source: Ambulatory Visit | Admitting: Radiation Oncology

## 2015-01-23 ENCOUNTER — Telehealth: Payer: Self-pay | Admitting: Oncology

## 2015-01-23 NOTE — Telephone Encounter (Signed)
Reached patient with Administrator, sports.  Today's appointment with Dr. Sondra Come cancelled.  New appointment 02/23/15 at 4:40 pm.  Patient confirmed

## 2015-01-23 NOTE — Telephone Encounter (Signed)
[facility] and notified her that her appointment for today with Dr. Sondra Come needs to be rescheduled.  She verbalized understanding.  Tried to transfer her the schedulers to reschedule her appointment but she hung up before the transfer could go through.

## 2015-02-23 ENCOUNTER — Ambulatory Visit
Admission: RE | Admit: 2015-02-23 | Discharge: 2015-02-23 | Disposition: A | Discharge status: Home / Self Care | Payer: Self-pay | Source: Ambulatory Visit | Attending: Radiation Oncology | Admitting: Radiation Oncology

## 2015-02-23 ENCOUNTER — Encounter: Payer: Self-pay | Admitting: Radiation Oncology

## 2015-02-23 VITALS — BP 115/65 | HR 80 | Temp 97.8°F | Ht 62.0 in | Wt 165.4 lb

## 2015-02-23 DIAGNOSIS — C539 Malignant neoplasm of cervix uteri, unspecified: Secondary | ICD-10-CM

## 2015-02-23 NOTE — Progress Notes (Signed)
  Radiation Oncology         [phone] ________________________________  Name: Diane King MRN: [id number]  Date: 02/23/2015  DOB: [birth year]  Follow-Up Visit Note  CC: No PCP Per Patient  No ref. provider found   Diagnosis:   Stage IB cervical cancer  Interval Since Last Radiation:  6 years. The patient completed external beam radiation therapy as well as a cesium 137 implant   Narrative:  The patient returns today for routine follow-up. She reports she is eating well. She denies pain or any vaginal or rectal bleeding. She denies any pain or bleeding during intercourse. She denies hematuria. She has no concerns at this time. She saw Dr. Denman George last June. An interpreter was present during this encounter.  ALLERGIES:  has No Known Allergies.  Meds: Current Outpatient Prescriptions  Medication Sig Dispense Refill  . b complex vitamins tablet Take 1 tablet by mouth daily.    . Multiple Vitamins-Minerals (MULTIVITAL PO) Take 1 tablet by mouth daily.     No current facility-administered medications for this encounter.    Physical Findings: The patient is in no acute distress. Patient is alert and oriented. Accompanied by an interpreter  height is 5\' 2"  (1.575 m) and weight is 165 lb 6.4 oz (75.025 kg). Her temperature is 97.8 F (36.6 C). Her blood pressure is 115/65 and her pulse is 80.  No palpable subclavicular or axillary adenopathy. Lungs clear to auscultation. The heart has regular rhythm and rate. The abdomen is soft and nontender with normal bowel sounds. No inguinal adenopathy appreciated. On pelvic examination, the external genitalia are unremarkable. Speculum exam was performed. There are no mucosal lesions noted in the vaginal vault. The cervical os is identified and flushed with the upper vagina. Mild radiation changes noted in the proximal vagina No Pap smear was obtained. On bimanual and rectovaginal examination there no pelvic masses appreciated.  Lab  Findings: Lab Results  Component Value Date   WBC 3.0* 06/21/2010   HGB 11.5* 06/21/2010   HCT 34.5* 06/21/2010   MCV 88.6 06/21/2010   PLT 199 06/21/2010    Radiographic Findings: No results found.  Impression: No evidence on clinical exam today.  Plan: Routine followup in one year. In the interim the patient will be seen by gynecologic oncology approximately 6 months from now.  ____________________________________ Blair Promise, Ph.D., M.D.  This document serves as a record of services personally performed by Gery Pray, MD. It was created on his behalf by Darcus Austin, a trained medical scribe. The creation of this record is based on the scribe's personal observations and the provider's statements to them. This document has been checked and approved by the attending provider.

## 2015-02-23 NOTE — Progress Notes (Signed)
[name] is here for follow up of radiation completed over 5 years ago for cervical cancer. She reports she is eating well and she denies pain also. She denies any vaginal or rectal bleeding. She has no concerns at this time.   BP 115/65 mmHg  Pulse 80  Temp(Src) 97.8 F (36.6 C)  Ht 5\' 2"  (1.575 m)  Wt 165 lb 6.4 oz (75.025 kg)  BMI 30.24 kg/m2   Wt Readings from Last 3 Encounters:  02/23/15 165 lb 6.4 oz (75.025 kg)  08/08/14 148 lb 9.6 oz (67.405 kg)  08/04/14 150 lb (68.04 kg)

## 2015-07-28 ENCOUNTER — Other Ambulatory Visit: Payer: Self-pay | Admitting: Obstetrics and Gynecology

## 2015-07-28 DIAGNOSIS — Z1231 Encounter for screening mammogram for malignant neoplasm of breast: Secondary | ICD-10-CM

## 2015-08-07 ENCOUNTER — Other Ambulatory Visit (HOSPITAL_COMMUNITY)
Admission: RE | Admit: 2015-08-07 | Discharge: 2015-08-07 | Disposition: A | Discharge status: Home / Self Care | Payer: No Typology Code available for payment source | Source: Ambulatory Visit | Attending: Gynecologic Oncology | Admitting: Gynecologic Oncology

## 2015-08-07 ENCOUNTER — Ambulatory Visit: Payer: Self-pay | Attending: Gynecologic Oncology | Admitting: Gynecologic Oncology

## 2015-08-07 ENCOUNTER — Encounter: Payer: Self-pay | Admitting: Gynecologic Oncology

## 2015-08-07 VITALS — BP 113/66 | HR 74 | Temp 98.0°F | Resp 18 | Wt 169.2 lb

## 2015-08-07 DIAGNOSIS — Z9889 Other specified postprocedural states: Secondary | ICD-10-CM | POA: Insufficient documentation

## 2015-08-07 DIAGNOSIS — Z923 Personal history of irradiation: Secondary | ICD-10-CM

## 2015-08-07 DIAGNOSIS — Z9221 Personal history of antineoplastic chemotherapy: Secondary | ICD-10-CM | POA: Insufficient documentation

## 2015-08-07 DIAGNOSIS — Z803 Family history of malignant neoplasm of breast: Secondary | ICD-10-CM | POA: Insufficient documentation

## 2015-08-07 DIAGNOSIS — Z01411 Encounter for gynecological examination (general) (routine) with abnormal findings: Secondary | ICD-10-CM | POA: Insufficient documentation

## 2015-08-07 DIAGNOSIS — D509 Iron deficiency anemia, unspecified: Secondary | ICD-10-CM | POA: Insufficient documentation

## 2015-08-07 DIAGNOSIS — Z8541 Personal history of malignant neoplasm of cervix uteri: Secondary | ICD-10-CM | POA: Insufficient documentation

## 2015-08-07 DIAGNOSIS — C531 Malignant neoplasm of exocervix: Secondary | ICD-10-CM

## 2015-08-07 NOTE — Progress Notes (Signed)
GYNECOLOGIC ONCOLOGY OFFICE VISIT   Diane King 50 y.o. female  CC:  Hx of cervical cancer The patient was seen and counseled with an interpretor.  CHIEF COMPLAINT: Surveillance for stage IB cervical cancer.   HISTORY OF PRESENT ILLNESS: This is a 50 y.o. 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  INTERVAL NOTE  Diane King is doing well.  Is using herbal life and he feels as if she has more energy and overall feels much better.  Pap test collected in November of 2014 was ASC hrHPV+ Pap test collected by Dr Sondra Come in 12/15 revealed ASCUS, hrHPV positive. The followup colposcopy and biopsy revealed atrophic tissue, no dysplasia or recurrent cancer. She is doing well with no specific complaints. She is sexually active. Her sister has just been diagnosed with metastatic breast cancer. She has no symptoms concerning for recurrence.  SOCIAL HISTORY:  Social History   Social History  . Marital Status: Single    Spouse Name: N/A  . Number of Children: N/A  . Years of Education: N/A   Occupational History  .      widowed   Social History Main Topics  . Smoking status: Never Smoker   . Smokeless tobacco: Not on file  . Alcohol Use: No  . Drug Use: No     Comment: Patient info given.  Pt. denies smoking   . Sexual Activity: Yes   Other Topics Concern  . Not on file   Social History Narrative  Is not employed  PAST SURGICAL HISTORY  Past Surgical History  Procedure Laterality Date  . Bilateral salpingoophorectomy  2011    Bilateral pelvic lymph node dissection    PAST MEDICAL HISTORY  Past Medical History  Diagnosis Date   . Cervical cancer ([place]) 07-27-2009 CLINICAL IB1    POORLY DIFFERENTIATED SQUAMOUS CELL CARCINOMA  . Anemia     MILD MICROCYTIC ANEMIA  . History of chemotherapy 11/15/09    Cisplatin 4 cycles weekly completed 11/15/09  . Hx of radiation therapy 10/10/09-11/14/09    cervical   . Hx of radiation therapy 12/14/09-12/17/09    cesium implant intracavity    FAMILY HISTORY  Family History  Problem Relation Age of Onset  . Breast cancer Sister     REVIEW OF SYSTEMS Constitutional  Feels well,  Cardiovascular  No chest pain, shortness of breath, or edema  Pulmonary  Occasional  cough  Productive of white/yellow sputum. No wheeze.  Gastro Intestinal  No nausea, vomitting, or diarrhoea. No bright red blood per rectum, no abdominal pain, change in bowel movement, or constipation.  Genito Urinary  No frequency, urgency, dysuria, + bleeding or discharge.  Musculo Skeletal  No myalgia, arthralgia, joint swelling or pain  Neurologic  No weakness, numbness, change in gait,  Psychology  No depression, anxiety, insomnia.     Vitals:  Blood pressure 113/66, pulse 74, temperature 98 F (36.7 C), temperature source Oral, resp. rate 18, weight 169 lb 3.2 oz (76.749 kg), SpO2 100 %. Wt Readings from Last 3 Encounters:  08/07/15 169 lb 3.2 oz (76.749 kg)  02/23/15 165 lb 6.4 oz (75.025 kg)  08/08/14 148 lb 9.6 oz (67.405 kg)    PHYSICAL  EXAMINATION WD female in NAD Neck  Supple without any enlargements. No maxillary or frontal sinus discomfort with pressure. Lymph node survey. No cervical supraclavicular cervical or inguinal adenopathy Cardiovascular  Pulse normal rate, regularity and rhythm. Lungs  Clear to auscultation bilateraly,  Psychiatry  Alert and oriented appropriate mood and affect Back No CVA tenderness Abdomen  Normoactive bowel sounds, abdomen soft, non-tender and obese.  Midline surgical incision intact without evidence of hernia.  Genito Urinary  Vulva/vagina: Normal  external female genitalia.  No lesions. Pap taken  Bladder/urethra:  No lesions or masses  Cervix The cervix is flush with the vaginal vault, no lesions or irregularities. No RV septal nodularity or parametrial induration.     Assessment:  This is a 50 y.o. year old stage IB cervical carcinoma treated with definitive chemoradiation completed in 2011.  Multiple paps during surveillance demonstrated atypical squamous cells. Colpo and biopsies negative. 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   No evidence for recurrence on exam.  Plan Follow up with GYN oncology in 12 months  Donaciano Eva, MD .

## 2015-08-07 NOTE — Patient Instructions (Signed)
We will contact you with the results from the PAP smear obtained today , please call in 6 months to schedule your one year follow up appointment with Dr Everitt Amber

## 2015-08-10 ENCOUNTER — Ambulatory Visit (HOSPITAL_COMMUNITY): Payer: Self-pay

## 2015-08-10 LAB — CYTOLOGY - PAP

## 2015-08-11 ENCOUNTER — Telehealth: Payer: Self-pay | Admitting: Gynecologic Oncology

## 2015-08-17 ENCOUNTER — Telehealth: Payer: Self-pay

## 2015-08-17 NOTE — Telephone Encounter (Signed)
Orders received from [facility], APNP to contact the patient with PAP result ( Normal ) . Patient contacted via [place] interpreter [place] : [phone], [place] ID # [phone] . No answer , left a detailed message in spanish with call back number provided, if additional questions or concerns arise.

## 2015-08-17 NOTE — Telephone Encounter (Signed)
error 

## 2015-08-23 ENCOUNTER — Telehealth (HOSPITAL_COMMUNITY): Payer: Self-pay | Admitting: *Deleted

## 2015-08-23 NOTE — Telephone Encounter (Signed)
Telephoned patient at home # and left message about BCCCP appointment July 13 8:00. Used interpreter Anastasio Auerbach

## 2015-08-24 ENCOUNTER — Telehealth: Payer: Self-pay

## 2015-08-24 ENCOUNTER — Ambulatory Visit (HOSPITAL_COMMUNITY)
Admission: RE | Admit: 2015-08-24 | Discharge: 2015-08-24 | Disposition: A | Discharge status: Home / Self Care | Payer: Self-pay | Source: Ambulatory Visit | Attending: Obstetrics and Gynecology | Admitting: Obstetrics and Gynecology

## 2015-08-24 ENCOUNTER — Encounter (HOSPITAL_COMMUNITY): Payer: Self-pay

## 2015-08-24 ENCOUNTER — Ambulatory Visit
Admission: RE | Admit: 2015-08-24 | Discharge: 2015-08-24 | Disposition: A | Discharge status: Home / Self Care | Payer: No Typology Code available for payment source | Source: Ambulatory Visit | Attending: Obstetrics and Gynecology | Admitting: Obstetrics and Gynecology

## 2015-08-24 VITALS — BP 108/64 | Temp 98.0°F | Ht 62.5 in | Wt 168.8 lb

## 2015-08-24 DIAGNOSIS — Z1231 Encounter for screening mammogram for malignant neoplasm of breast: Secondary | ICD-10-CM

## 2015-08-24 DIAGNOSIS — Z1239 Encounter for other screening for malignant neoplasm of breast: Secondary | ICD-10-CM

## 2015-08-24 NOTE — Progress Notes (Signed)
No complaints today.   Pap Smear:  Pap smear not completed today. Last Pap smear was 08/07/2015 at the [facility] and normal. Patient has a history of and abnormal Pap smear 12/21/2012 that was ASCUS with positive HPV and cervical cancer. Patient is followed by GYN Oncology at the [facility]. Pap smear results are in EPIC.  Physical exam: Breasts Breasts symmetrical. No skin abnormalities bilateral breasts. No nipple retraction bilateral breasts. No nipple discharge bilateral breasts. No lymphadenopathy. No lumps palpated bilateral breasts. No complaints of pain or tenderness on exam. Referred patient to the [place] for a screening mammogram. Appointment scheduled for Thursday, August 24, 2015 at 1000.  Pelvic/Bimanual No Pap smear completed today since last Pap smear was 08/07/2015. Pap smear not indicated per BCCCP guidelines.   Smoking History: Patient has never smoked.  Patient Navigation: Patient education provided. Access to services provided for patient through the [facility] program. Spanish interpreter provided. Referred patient to the [facility] program for health assessment and referral to PCP.  Colorectal Cancer Screening: Patient has never had a colonoscopy. No complaints today.  Used Spanish interpreter [employer] from [place].

## 2015-08-24 NOTE — Telephone Encounter (Addendum)
Called per Anastasio Auerbach interpreter about [place] voice mail and ask patient to return the phone call.

## 2015-08-24 NOTE — Patient Instructions (Signed)
Educational materials on self breast awareness given. Explained to Diane King that she did not need a Pap smear today due to last Pap smear was 08/07/2015. Due to patients history of cervical cancer she is followed by GYN Oncology at the [facility] for follow up and Pap smears. Referred patient to the [place] for a screening mammogram. Appointment scheduled for Thursday, August 24, 2015 at 1000. Let patient know the Breast Center will follow up with her within the next couple weeks with results of mammogram by letter or phone. Diane King verbalized understanding.  Diane King, Arvil Chaco, RN 8:30 AM

## 2015-08-25 ENCOUNTER — Encounter (HOSPITAL_COMMUNITY): Payer: Self-pay | Admitting: *Deleted

## 2015-08-30 ENCOUNTER — Telehealth: Payer: Self-pay

## 2015-08-30 NOTE — Telephone Encounter (Signed)
This is the second attempt to reach patient concerning re screening with [place] interpreter called and left message on voicemail to return call at [phone].

## 2016-02-28 ENCOUNTER — Other Ambulatory Visit (HOSPITAL_COMMUNITY)
Admission: RE | Admit: 2016-02-28 | Discharge: 2016-02-28 | Disposition: A | Discharge status: Home / Self Care | Payer: No Typology Code available for payment source | Source: Ambulatory Visit | Attending: Radiation Oncology | Admitting: Radiation Oncology

## 2016-02-28 ENCOUNTER — Encounter: Payer: Self-pay | Admitting: Radiation Oncology

## 2016-02-28 ENCOUNTER — Ambulatory Visit
Admission: RE | Admit: 2016-02-28 | Discharge: 2016-02-28 | Disposition: A | Discharge status: Home / Self Care | Payer: No Typology Code available for payment source | Source: Ambulatory Visit | Attending: Radiation Oncology | Admitting: Radiation Oncology

## 2016-02-28 DIAGNOSIS — R932 Abnormal findings on diagnostic imaging of liver and biliary tract: Secondary | ICD-10-CM | POA: Insufficient documentation

## 2016-02-28 DIAGNOSIS — Z803 Family history of malignant neoplasm of breast: Secondary | ICD-10-CM | POA: Insufficient documentation

## 2016-02-28 DIAGNOSIS — C531 Malignant neoplasm of exocervix: Secondary | ICD-10-CM

## 2016-02-28 DIAGNOSIS — Z9889 Other specified postprocedural states: Secondary | ICD-10-CM | POA: Insufficient documentation

## 2016-02-28 DIAGNOSIS — Z01411 Encounter for gynecological examination (general) (routine) with abnormal findings: Secondary | ICD-10-CM | POA: Insufficient documentation

## 2016-02-28 NOTE — Progress Notes (Signed)
Diane King is here for follow up.  She denies having pain or urinary issues.  She reports she is taking metamucil to regulate her bowel movements.  She denies having any vaginal bleeding.  She reports having a good appetite and energy level.  BP 114/74 (BP Location: Right Arm, Patient Position: Sitting)   Pulse 82   Temp 99 F (37.2 C) (Oral)   Ht 5' 2.5" (1.588 m)   Wt 172 lb 3.2 oz (78.1 kg)   SpO2 99%   BMI 30.99 kg/m    Wt Readings from Last 3 Encounters:  02/28/16 172 lb 3.2 oz (78.1 kg)  08/24/15 168 lb 12.8 oz (76.6 kg)  08/07/15 169 lb 3.2 oz (76.7 kg)

## 2016-02-28 NOTE — Progress Notes (Signed)
  Radiation Oncology         [phone] ________________________________  Name: Diane King MRN: [id number]  Date: 02/28/2016  DOB: May 23, 1965  Follow-Up Visit Note  CC: No PCP Per Patient  Dierdre Forth, MD    ICD-9-CM ICD-10-CM   1. 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 Metastatic disease was identified within the pelvic and periaortic nodes as such she underwent chemoradiation therapy which was completed in October 2011.  Interval Since Last Radiation:  6 years and 3 months,  cesium 137 implant completed here in November 2011  Narrative:  The patient returns today for routine follow-up.  She is doing well at this time. She occasionally will have constipation and takes Metamucil for this issue. She denies any vaginal bleeding hematuria or rectal bleeding or pelvic pain or low back pain.          Pap test collected in November of 2014 was ASC hrHPV+ Pap test collected by myself in 12/15 revealed ASCUS, hrHPV positive. The followup colposcopy and biopsy revealed atrophic tissue, no dysplasia or recurrent cancer.  She is doing well with no specific complaints. She is sexually active. Her sister has just been diagnosed with metastatic breast cancer.    She is followed as part of the BCCCP progam.  Normal screening mammogram in July of last year.  Accompanied by interpreter today                ALLERGIES:  has No Known Allergies.  Meds: No current outpatient prescriptions on file.   No current facility-administered medications for this encounter.     Physical Findings: The patient is in no acute distress. Patient is alert and oriented.  height is 5' 2.5" (1.588 m) and weight is 172 lb 3.2 oz (78.1 kg). Her oral temperature is 99 F (37.2 C).  Her blood pressure is 114/74 and her pulse is 82. Her oxygen saturation is 99%. .  No palpable subclavicular or axillary adenopathy. The lungs are clear to auscultation. The heart has a regular rhythm and rate. The abdomen is soft and nontender with normal bowel sounds. No inguinal adenopathy appreciated. On pelvic examination the external genitalia are unremarkable. A speculum exam is performed. No mucosal lesions noted in the vaginal vault. The cervix is flush with the upper vaginal vault. Some radiation changes noted along the upper vaginal region. A Pap smear was obtained of the cervical region. On bimanual and rectovaginal examination there no pelvic masses appreciated.  Lab Findings: Lab Results  Component Value Date   WBC 3.0 (L) 06/21/2010   HGB 11.5 (L) 06/21/2010   HCT 34.5 (L) 06/21/2010   MCV 88.6 06/21/2010   PLT 199 06/21/2010    Radiographic Findings: No results found.  Impression:  Stage IB cervical cancer status post definitive chemoradiation. The patient continues to be followed 6 years  out from her treatment in light of her relatively recent abnormal Pap smear.   Plan:  Routine follow-up in 1 year. The patient was seen by Dr. Denman George in approximately 6 months.  ____________________________________ Gery Pray, MD

## 2016-02-29 ENCOUNTER — Ambulatory Visit: Payer: No Typology Code available for payment source | Admitting: Radiation Oncology

## 2016-03-05 LAB — CYTOLOGY - PAP: DIAGNOSIS: NEGATIVE

## 2016-03-06 ENCOUNTER — Telehealth: Payer: Self-pay | Admitting: Oncology

## 2016-03-06 NOTE — Telephone Encounter (Signed)
Called [name] - Interpreter to notify patient of her good pap smear results per Dr. Sondra Come.  [name] said she would like [name] know the results.

## 2016-04-25 ENCOUNTER — Encounter (HOSPITAL_COMMUNITY): Payer: Self-pay | Admitting: Emergency Medicine

## 2016-04-25 ENCOUNTER — Ambulatory Visit (HOSPITAL_COMMUNITY)
Admission: EM | Admit: 2016-04-25 | Discharge: 2016-04-25 | Disposition: A | Discharge status: Home / Self Care | Payer: No Typology Code available for payment source | Attending: Family Medicine | Admitting: Family Medicine

## 2016-04-25 DIAGNOSIS — R110 Nausea: Secondary | ICD-10-CM

## 2016-04-25 DIAGNOSIS — J0100 Acute maxillary sinusitis, unspecified: Secondary | ICD-10-CM

## 2016-04-25 MED ORDER — METHYLPREDNISOLONE 4 MG PO TBPK: ORAL_TABLET | ORAL | 0 refills | Status: DC

## 2016-04-25 MED ORDER — AMOXICILLIN 875 MG PO TABS: 875.0000 mg | ORAL_TABLET | Freq: Two times a day (BID) | ORAL | 0 refills | Status: DC

## 2016-04-25 MED ORDER — ONDANSETRON 4 MG PO TBDP: 4.0000 mg | ORAL_TABLET | Freq: Three times a day (TID) | ORAL | 0 refills | Status: DC | PRN

## 2016-04-25 MED ORDER — IPRATROPIUM BROMIDE 0.06 % NA SOLN: 2.0000 | Freq: Four times a day (QID) | NASAL | 0 refills | Status: DC

## 2016-04-25 NOTE — ED Provider Notes (Signed)
CSN: [id number]     Arrival date & time 04/25/16  1447 History   First MD Initiated Contact with Patient 04/25/16 1519     Chief Complaint  Patient presents with  . Headache  . Sore Throat  . Nausea   (Consider location/radiation/quality/duration/timing/severity/associated sxs/prior Treatment) Patient c/o facial pain and uri sx's for over a week.  She c/o intermittant nausea.   The history is provided by the patient.  Headache  Pain location:  Frontal Quality:  Sharp and dull Radiates to:  Does not radiate Severity currently:  4/10 Onset quality:  Sudden Duration:  8 days Timing:  Constant Chronicity:  New Similar to prior headaches: no   Relieved by:  None tried Worsened by:  Nothing Associated symptoms: congestion, fatigue, fever, nausea, sinus pressure, sore throat and URI   Sore Throat  Associated symptoms include headaches.    Past Medical History:  Diagnosis Date  . Cervical cancer ([place]) 07-27-2009 CLINICAL IB1   POORLY DIFFERENTIATED SQUAMOUS CELL CARCINOMA  . History of chemotherapy 11/15/09   Cisplatin 4 cycles weekly completed 11/15/09  . Hx of radiation therapy 10/10/09-11/14/09   cervical   . Hx of radiation therapy 12/14/09-12/17/09   cesium implant intracavity   Past Surgical History:  Procedure Laterality Date  . BILATERAL SALPINGOOPHORECTOMY  2011   Bilateral pelvic lymph node dissection   Family History  Problem Relation Age of Onset  . Breast cancer Sister    Social History  Substance Use Topics  . Smoking status: Never Smoker  . Smokeless tobacco: Never Used  . Alcohol use No   OB History    Gravida Para Term Preterm AB Living   5 4 4   1 4    SAB TAB Ectopic Multiple Live Births   1             Review of Systems  Constitutional: Positive for fatigue and fever.  HENT: Positive for congestion, sinus pressure and sore throat.   Eyes: Negative.   Respiratory: Negative.   Cardiovascular: Negative.   Gastrointestinal: Positive for nausea.   Endocrine: Negative.   Genitourinary: Negative.   Musculoskeletal: Negative.   Allergic/Immunologic: Negative.   Neurological: Positive for headaches.  Hematological: Negative.   Psychiatric/Behavioral: Negative.     Allergies  Patient has no known allergies.  Home Medications   Prior to Admission medications   Medication Sig Start Date End Date Taking? Authorizing Provider  amoxicillin (AMOXIL) 875 MG tablet Take 1 tablet (875 mg total) by mouth 2 (two) times daily. 04/25/16   Lysbeth Penner, FNP  ipratropium (ATROVENT) 0.06 % nasal spray Place 2 sprays into both nostrils 4 (four) times daily. 04/25/16   Lysbeth Penner, FNP  methylPREDNISolone (MEDROL DOSEPAK) 4 MG TBPK tablet Take 6-5-4-3-2-1 po qd 04/25/16   Lysbeth Penner, FNP   Meds Ordered and Administered this Visit  Medications - No data to display  BP 117/67 (BP Location: Right Arm)   Pulse 81   Temp 98.6 F (37 C) (Oral)   SpO2 98%  No data found.   Physical Exam  Constitutional: She is oriented to person, place, and time. She appears well-developed and well-nourished.  HENT:  Head: Normocephalic and atraumatic.  Right Ear: External ear normal.  Left Ear: External ear normal.  Mouth/Throat: Oropharynx is clear and moist.  Eyes: Conjunctivae and EOM are normal. Pupils are equal, round, and reactive to light.  Neck: Normal range of motion. Neck supple.  Cardiovascular: Normal rate, regular rhythm and  normal heart sounds.   Pulmonary/Chest: Effort normal and breath sounds normal.  Neurological: She is alert and oriented to person, place, and time.  Nursing note and vitals reviewed.   Urgent Care Course     Procedures (including critical care time)  Labs Review Labs Reviewed - No data to display  Imaging Review No results found.   Visual Acuity Review  Right Eye Distance:   Left Eye Distance:   Bilateral Distance:    Right Eye Near:   Left Eye Near:    Bilateral Near:         MDM    1. Subacute maxillary sinusitis    Amoxicillin 875mg  one po bid x 10 days #20 Medrol dose pack as directed 4mg  #21 Atrovent nasal spray Zofran ODT 4mg  one po tid prn #21  Push po fluids, rest, tylenol and motrin otc prn as directed for fever, arthralgias, and myalgias.  Follow up prn if sx's continue or persist.   Lysbeth Penner, FNP 04/25/16 1535

## 2016-04-25 NOTE — ED Triage Notes (Signed)
Spanish Interpreter used: Lannette Donath [phone]  Pt complains of nausea, sore throat and headache for 8 days.

## 2016-08-01 ENCOUNTER — Telehealth: Payer: Self-pay | Admitting: *Deleted

## 2016-08-01 NOTE — Telephone Encounter (Signed)
[name], the spanish interpreter called of behalf of the patient. The patient cancelled and rescheduled the appt form Monday June 25th to Monday July 16th.

## 2016-08-05 ENCOUNTER — Ambulatory Visit: Payer: No Typology Code available for payment source | Admitting: Gynecologic Oncology

## 2016-08-26 ENCOUNTER — Ambulatory Visit: Payer: Self-pay | Attending: Gynecologic Oncology | Admitting: Gynecologic Oncology

## 2016-08-26 ENCOUNTER — Encounter: Payer: Self-pay | Admitting: Gynecologic Oncology

## 2016-08-26 ENCOUNTER — Ambulatory Visit: Payer: No Typology Code available for payment source | Admitting: Gynecologic Oncology

## 2016-08-26 VITALS — BP 127/68 | HR 80 | Temp 98.3°F | Resp 20 | Wt 177.0 lb

## 2016-08-26 DIAGNOSIS — Z9221 Personal history of antineoplastic chemotherapy: Secondary | ICD-10-CM | POA: Insufficient documentation

## 2016-08-26 DIAGNOSIS — Z9079 Acquired absence of other genital organ(s): Secondary | ICD-10-CM | POA: Insufficient documentation

## 2016-08-26 DIAGNOSIS — E669 Obesity, unspecified: Secondary | ICD-10-CM | POA: Insufficient documentation

## 2016-08-26 DIAGNOSIS — Z8541 Personal history of malignant neoplasm of cervix uteri: Secondary | ICD-10-CM | POA: Insufficient documentation

## 2016-08-26 DIAGNOSIS — Z90722 Acquired absence of ovaries, bilateral: Secondary | ICD-10-CM | POA: Insufficient documentation

## 2016-08-26 DIAGNOSIS — Z923 Personal history of irradiation: Secondary | ICD-10-CM | POA: Insufficient documentation

## 2016-08-26 DIAGNOSIS — C531 Malignant neoplasm of exocervix: Secondary | ICD-10-CM

## 2016-08-26 DIAGNOSIS — Z803 Family history of malignant neoplasm of breast: Secondary | ICD-10-CM | POA: Insufficient documentation

## 2016-08-26 NOTE — Progress Notes (Signed)
GYNECOLOGIC ONCOLOGY OFFICE VISIT   Diane King 51 y.o. female  CC:  Hx of cervical cancer The patient was seen and counseled with an interpretor.  CHIEF COMPLAINT: Surveillance for stage IB cervical cancer.   HISTORY OF PRESENT ILLNESS: This is a 51 y.o. 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  INTERVAL NOTE  Diane King is doing well.  Is using herbal life and she feels as if she has more energy and overall feels much better.  Pap test collected in November of 2014 was ASC hrHPV+ Pap test collected by Dr Sondra Come in 12/15 revealed ASCUS, hrHPV positive. The followup colposcopy and biopsy revealed atrophic tissue, no dysplasia or recurrent cancer. She is doing well with no specific complaints. She is sexually active. Her sister has just been diagnosed with metastatic breast cancer. She has no symptoms concerning for recurrence.  Pap test in January, 2018 with Dr Sondra Come was normal.  SOCIAL HISTORY:  Social History   Social History  . Marital status: Single    Spouse name: N/A  . Number of children: N/A  . Years of education: N/A   Occupational History  .  Unemployed    widowed   Social History Main Topics  . Smoking status: Never Smoker  . Smokeless tobacco: Never Used  . Alcohol use No  . Drug use: No     Comment: Patient info given.  Pt. denies smoking   . Sexual activity: Yes    Birth control/ protection: None   Other Topics Concern  . Not on file   Social History Narrative  . No narrative on file  Is not employed  PAST SURGICAL HISTORY  Past Surgical History:  Procedure Laterality Date  . BILATERAL  SALPINGOOPHORECTOMY  2011   Bilateral pelvic lymph node dissection    PAST MEDICAL HISTORY  Past Medical History:  Diagnosis Date  . Cervical cancer ([place]) 07-27-2009 CLINICAL IB1   POORLY DIFFERENTIATED SQUAMOUS CELL CARCINOMA  . History of chemotherapy 11/15/09   Cisplatin 4 cycles weekly completed 11/15/09  . Hx of radiation therapy 10/10/09-11/14/09   cervical   . Hx of radiation therapy 12/14/09-12/17/09   cesium implant intracavity    FAMILY HISTORY  Family History  Problem Relation Age of Onset  . Breast cancer Sister     REVIEW OF SYSTEMS Constitutional  Feels well,  Cardiovascular  No chest pain, shortness of breath, or edema  Pulmonary  Occasional  cough  Productive of white/yellow sputum. No wheeze.  Gastro Intestinal  No nausea, vomitting, or diarrhoea. No bright red blood per rectum, no abdominal pain, change in bowel movement, or constipation.  Genito Urinary  No frequency, urgency, dysuria, + bleeding or discharge.  Musculo Skeletal  No myalgia, arthralgia, joint swelling or pain  Neurologic  No weakness, numbness, change in gait,  Psychology  No depression, anxiety, insomnia.     Vitals:  Blood pressure 127/68, pulse 80, temperature 98.3 F (36.8 C), temperature source Oral, resp. rate 20, weight 177 lb (80.3 kg), SpO2 100 %. Wt Readings from Last 3 Encounters:  08/26/16 177 lb (80.3 kg)  02/28/16 172 lb 3.2 oz (78.1 kg)  08/24/15 168 lb 12.8 oz (76.6 kg)  PHYSICAL EXAMINATION WD female in NAD Neck  Supple without any enlargements. No maxillary or frontal sinus discomfort with pressure. Lymph node survey. No cervical supraclavicular cervical or inguinal adenopathy Cardiovascular  Pulse normal rate, regularity and rhythm. Lungs  Clear to auscultation bilateraly,  Psychiatry  Alert and oriented appropriate mood and affect Back No CVA tenderness Abdomen  Normoactive bowel sounds, abdomen soft, non-tender and obese.  Midline surgical incision  intact without evidence of hernia.  Genito Urinary  Vulva/vagina: Normal external female genitalia.  No lesions. Pap taken  Bladder/urethra:  No lesions or masses  Cervix The cervix is flush with the vaginal vault, no lesions or irregularities. No RV septal nodularity or parametrial induration.     Assessment:  This is a 51 y.o. year old stage IB cervical carcinoma treated with definitive chemoradiation completed in 2011.  Multiple paps during surveillance demonstrated atypical squamous cells. Colpo and biopsies negative. 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   No evidence for recurrence on exam.  Plan Follow up with GYN oncology in 12 months  Donaciano Eva, MD .

## 2016-08-26 NOTE — Patient Instructions (Signed)
Please notify Dr Denman George at phone number [phone] if you notice vaginal bleeding, new pelvic or abdominal pains, bloating, feeling full easy, or a change in bladder or bowel function.   Please return to see Dr Denman George in 12 months (Approximately July, 2019). You can call the office at [phone] in April, 2019 to make that appointment.

## 2016-08-28 ENCOUNTER — Ambulatory Visit: Payer: No Typology Code available for payment source | Admitting: Gynecologic Oncology

## 2016-09-04 ENCOUNTER — Other Ambulatory Visit: Payer: Self-pay | Admitting: Obstetrics and Gynecology

## 2016-09-04 DIAGNOSIS — R928 Other abnormal and inconclusive findings on diagnostic imaging of breast: Secondary | ICD-10-CM

## 2016-09-19 ENCOUNTER — Ambulatory Visit (HOSPITAL_COMMUNITY): Payer: No Typology Code available for payment source

## 2016-11-14 ENCOUNTER — Encounter (HOSPITAL_COMMUNITY): Payer: Self-pay

## 2016-11-14 ENCOUNTER — Ambulatory Visit (HOSPITAL_COMMUNITY)
Admission: RE | Admit: 2016-11-14 | Discharge: 2016-11-14 | Disposition: A | Discharge status: Home / Self Care | Payer: Self-pay | Source: Ambulatory Visit | Attending: Obstetrics and Gynecology | Admitting: Obstetrics and Gynecology

## 2016-11-14 ENCOUNTER — Ambulatory Visit
Admission: RE | Admit: 2016-11-14 | Discharge: 2016-11-14 | Disposition: A | Discharge status: Home / Self Care | Payer: No Typology Code available for payment source | Source: Ambulatory Visit | Attending: Obstetrics and Gynecology | Admitting: Obstetrics and Gynecology

## 2016-11-14 VITALS — BP 120/78 | Temp 98.0°F | Ht 63.0 in | Wt 177.8 lb

## 2016-11-14 DIAGNOSIS — R928 Other abnormal and inconclusive findings on diagnostic imaging of breast: Secondary | ICD-10-CM

## 2016-11-14 DIAGNOSIS — Z1239 Encounter for other screening for malignant neoplasm of breast: Secondary | ICD-10-CM

## 2016-11-14 NOTE — Patient Instructions (Addendum)
Educational materials on self breast awareness given. Explained to Diane King that she did not need a Pap smear today due to last Pap smear was 02/28/2016. Due to patients history of cervical cancer she is followed by GYN Oncology at the [facility] for follow up and Pap smears. Referred patient to the [facility] for a screening mammogram. Appointment scheduled for Thursday, November 14, 2016 at 1440. Let patient know the Breast Center will follow up with her within the next couple weeks with results of mammogram by letter or phone. Diane King verbalized understanding.  Diane King, Arvil Chaco, RN 3:55 PM

## 2016-11-14 NOTE — Progress Notes (Signed)
No complaints today.   Pap Smear:  Pap smear not completed today. Last Pap smear was 02/28/2016 at the [facility] and normal. Patient has a history of and abnormal Pap smear 12/21/2012 that was ASCUS with positive HPV and cervical cancer. Patient is followed by GYN Oncology at the [facility]. Pap smear results are in EPIC.  Physical exam: Breasts Breasts symmetrical. No skin abnormalities bilateral breasts. No nipple retraction bilateral breasts. No nipple discharge bilateral breasts. No lymphadenopathy. No lumps palpated bilateral breasts. No complaints of pain or tenderness on exam. Referred patient to the [facility] for a screening mammogram. Appointment scheduled for Thursday, November 14, 2016 at 1440.     Pelvic/Bimanual No Pap smear completed today since last Pap smear was 02/28/2016. Pap smear not indicated per BCCCP guidelines.   Smoking History: Patient has never smoked.  Patient Navigation: Patient education provided. Access to services provided for patient through the [facility] program. Spanish interpreter provided.   Colorectal Cancer Screening: Per patient has never had a colonoscopy completed. No complaints today. FIT Test given to patient to complete and return to BCCCP.  Used Spanish interpreter [employer] from [place].

## 2016-11-15 ENCOUNTER — Encounter (HOSPITAL_COMMUNITY): Payer: Self-pay | Admitting: *Deleted

## 2017-01-31 ENCOUNTER — Encounter (HOSPITAL_COMMUNITY): Payer: Self-pay

## 2017-03-13 ENCOUNTER — Other Ambulatory Visit (HOSPITAL_COMMUNITY)
Admission: RE | Admit: 2017-03-13 | Discharge: 2017-03-13 | Disposition: A | Discharge status: Home / Self Care | Payer: Self-pay | Source: Ambulatory Visit | Attending: Radiation Oncology | Admitting: Radiation Oncology

## 2017-03-13 ENCOUNTER — Ambulatory Visit
Admission: RE | Admit: 2017-03-13 | Discharge: 2017-03-13 | Disposition: A | Discharge status: Home / Self Care | Payer: Self-pay | Source: Ambulatory Visit | Attending: Radiation Oncology | Admitting: Radiation Oncology

## 2017-03-13 ENCOUNTER — Encounter: Payer: Self-pay | Admitting: Radiation Oncology

## 2017-03-13 ENCOUNTER — Other Ambulatory Visit: Payer: Self-pay

## 2017-03-13 VITALS — BP 120/73 | HR 79 | Temp 98.3°F | Ht 63.0 in | Wt 182.0 lb

## 2017-03-13 DIAGNOSIS — C539 Malignant neoplasm of cervix uteri, unspecified: Secondary | ICD-10-CM | POA: Insufficient documentation

## 2017-03-13 DIAGNOSIS — C531 Malignant neoplasm of exocervix: Secondary | ICD-10-CM | POA: Insufficient documentation

## 2017-03-13 DIAGNOSIS — Z803 Family history of malignant neoplasm of breast: Secondary | ICD-10-CM | POA: Insufficient documentation

## 2017-03-13 NOTE — Progress Notes (Signed)
Radiation Oncology         ([phone]) [phone] ________________________________  Name: Diane King MRN: [id number]  Date: 03/13/2017  DOB: 1965/11/09  Follow-Up Visit Note  CC: Patient, No Pcp Per  Dierdre Forth, MD    ICD-10-CM   1. Malignant neoplasm of exocervix ([place]) C53.1 Cytology - PAP    Diagnosis:  Stage IB cervical cancer, She underwent an exploratory laparotomy, but the plan for radical hysterectomy was aborted because metastatic disease was identified in the right cardinal ligament. Metastatic disease was also identified within the pelvic and periaortic nodes as such she underwent chemoradiation therapy which was completed in October 2011.  Interval Since Last Radiation:  7 years and 3 months,  cesium 137 implant completed here in November 2011  Narrative:  The patient returns today for routine follow-up.  She is doing well at this time. She denies pain, cough, breathing difficulties, urinary/bowel issues, vaginal bleeding/discharge, nausea, or fatigue.      Pap test collected in November of 2014 was ASC hrHPV+ Pap test collected by myself in 12/15 revealed ASCUS, hrHPV positive. The followup colposcopy and biopsy revealed atrophic tissue, no dysplasia or recurrent cancer. Pap test collected 02/28/16 was negative for intraepithelial lesions or malignancy. Benign reactive/reparative changes.  She is followed as part of the [facility]CP progam. Her sister was diagnosed with metastatic breast cancer. She denies a family history of ovarian cancer. Normal screening mammogram in October 2018.  Accompanied by interpreter today.                ALLERGIES:  has No Known Allergies.  Meds: No current outpatient medications on file.   No current facility-administered medications for this encounter.     Physical Findings: The patient is in no acute distress. Patient is alert and oriented.  height is 5\' 3"  (1.6 m) and weight is 182 lb (82.6 kg). Her oral temperature is 98.3 F (36.8 C).  Her blood pressure is 120/73 and her pulse is 79. Her oxygen saturation is 99%. .  No palpable subclavicular or axillary adenopathy. The lungs are clear to auscultation. The heart has a regular rhythm and rate. The abdomen is soft and nontender with normal bowel sounds. No inguinal adenopathy appreciated. On pelvic examination the external genitalia are unremarkable. A speculum exam is performed. No mucosal lesions noted in the vaginal vault. The cervix is flush with the upper vaginal vault. Some radiation changes noted along the upper vaginal region. A Pap smear was obtained of the cervical region. On bimanual and rectovaginal examination there no pelvic masses appreciated. : pelvic tissues quite pliable considering she received high-dose radiation therapy with external beam and intracavitary brachytherapy treatments   Lab Findings: Lab Results  Component Value Date   WBC 3.0 (L) 06/21/2010   HGB 11.5 (L) 06/21/2010   HCT 34.5 (L) 06/21/2010   MCV 88.6 06/21/2010   PLT 199 06/21/2010    Radiographic Findings: No results found.  Impression:  Stage IB cervical cancer status post definitive chemoradiation. No evidence of recurrence on clinical exam today. Pap smear pending  Plan: The patient will be seen by Dr. Denman George in approximately 6 months. Routine follow-up with radiation oncology in 1 year unless her Pap smear is normal, in which case she may be followed once a year by either gyn/onc or rad/onc. We will call the patient with her pap smear results with the assistance of an interpreter.  ____________________________________   This document serves as a record of services personally performed by  Gery Pray, MD. It was created on his behalf by Bethann Humble, a trained medical scribe. The creation of this record is based on the scribe's personal observations and the provider's statements to them. This document has been checked and approved by the attending provider.

## 2017-03-13 NOTE — Progress Notes (Signed)
Diane King is here for follow up.  She denies having pain, urinary/bowel issues or vaginal bleeding/discharge.  She also denies having nausea and fatigue.  BP 120/73 (BP Location: Right Arm, Patient Position: Sitting)   Pulse 79   Temp 98.3 F (36.8 C) (Oral)   Ht 5\' 3"  (1.6 m)   Wt 182 lb (82.6 kg)   SpO2 99%   BMI 32.24 kg/m    Wt Readings from Last 3 Encounters:  03/13/17 182 lb (82.6 kg)  11/14/16 177 lb 12.8 oz (80.6 kg)  08/26/16 177 lb (80.3 kg)

## 2017-03-18 LAB — CYTOLOGY - PAP: DIAGNOSIS: NEGATIVE

## 2017-03-21 ENCOUNTER — Telehealth: Payer: Self-pay | Admitting: Oncology

## 2017-03-21 NOTE — Telephone Encounter (Signed)
Mcarthur Rossetti, Interpreter, to let patient know about her normal pap smear results per Dr. Sondra Come.  [name] said she would let her know.

## 2017-06-16 ENCOUNTER — Telehealth: Payer: Self-pay | Admitting: *Deleted

## 2017-06-16 NOTE — Telephone Encounter (Signed)
Diane King the interpreter scheduled appt for the patient. She will give the patient the appt.

## 2017-09-11 ENCOUNTER — Telehealth: Payer: Self-pay | Admitting: *Deleted

## 2017-09-11 NOTE — Telephone Encounter (Signed)
Called and left the patient a message using [place] [employer]. Moved appt from 8/2 to 8/7, called and also notified [name]

## 2017-09-12 ENCOUNTER — Ambulatory Visit: Payer: Self-pay | Admitting: Gynecologic Oncology

## 2017-09-13 NOTE — Progress Notes (Signed)
GYNECOLOGIC ONCOLOGY OFFICE VISIT   Diane King 52 y.o. female     CC:  Hx of cervical cancer The patient was seen and counseled with an interpretor.  CHIEF COMPLAINT: Surveillance for stage IB cervical cancer.   Assessment:  This is a 52 y.o. 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  Multiple paps during surveillance demonstrated atypical squamous cells. Colpo and biopsies negative. 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   No evidence for recurrence on exam.  Plan Follow up with GYN oncology (Dr Skeet Latch) in 12 months  HISTORY OF PRESENT ILLNESS: This is a 52 y.o. 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  INTERVAL NOTE  Diane King is doing well.  Is using herbal life and he feels as if she has more energy and overall feels much better.  Pap test collected in November of 2014 was ASC hrHPV+ Pap test collected by Dr Sondra Come in 12/15 revealed ASCUS, hrHPV positive. The followup colposcopy and biopsy revealed atrophic tissue, no dysplasia or recurrent cancer. She is doing well with no specific complaints. She is sexually active. Her sister has just been diagnosed with metastatic breast cancer. She has no symptoms concerning for recurrence.  Pap test in January, 2019 with Dr Sondra Come was normal.  SOCIAL HISTORY:  Social History   Socioeconomic History  . Marital status: Single    Spouse name: Not on file  . Number of children: Not on file  . Years of education: Not on file  . Highest education level: Not on file  Occupational History    Employer: UNEMPLOYED   Comment: widowed  Social Needs  . Financial resource strain: Not on file  . Food insecurity:    Worry: Not on file    Inability: Not on file  . Transportation needs:    Medical: Not on file    Non-medical: Not on file  Tobacco Use  . Smoking status: Never Smoker  . Smokeless tobacco: Never Used  Substance and Sexual Activity  . Alcohol use: No  . Drug use: No    Comment: Patient info given.  Pt. denies smoking   . Sexual activity: Yes    Birth control/protection: None  Lifestyle  . Physical activity:    Days per week: Not on file    Minutes per session: Not on file  . Stress: Not on file  Relationships  . Social connections:    Talks on phone: Not on file    Gets together: Not on file    Attends religious service: Not on file    Active member of club or organization: Not on file    Attends meetings of clubs or organizations: Not on file    Relationship status: Not on file  . Intimate partner violence:    Fear of current or ex partner: Not on file    Emotionally abused: Not on file    Physically abused: Not on file    Forced sexual activity: Not on file  Other Topics Concern  . Not on file  Social History Narrative  . Not on file  Is not employed  PAST SURGICAL HISTORY  Past Surgical History:  Procedure Laterality Date  . BILATERAL SALPINGOOPHORECTOMY  2011   Bilateral pelvic lymph node dissection    PAST MEDICAL HISTORY  Past Medical History:  Diagnosis Date  . Cervical cancer ([place]) 07-27-2009 CLINICAL IB1   POORLY DIFFERENTIATED SQUAMOUS CELL CARCINOMA  . History of chemotherapy 11/15/09   Cisplatin 4 cycles weekly completed 11/15/09  . Hx of radiation therapy 10/10/09-11/14/09   cervical   . Hx of radiation therapy 12/14/09-12/17/09   cesium implant intracavity    FAMILY HISTORY  Family History  Problem Relation Age of Onset  . Breast cancer Sister     REVIEW OF SYSTEMS Constitutional  Feels well,  Cardiovascular  No chest pain, shortness of breath,  or edema  Pulmonary  Occasional  cough  Productive of white/yellow sputum. No wheeze.  Gastro Intestinal  No nausea, vomitting, or diarrhoea. No bright red blood per rectum, no abdominal pain, change in bowel movement, or constipation.  Genito Urinary  No frequency, urgency, dysuria, + bleeding or discharge.  Musculo Skeletal  No myalgia, arthralgia, joint swelling or pain  Neurologic  No weakness, numbness, change in gait,  Psychology  No depression, anxiety, insomnia.     Vitals:  Blood pressure 119/80, pulse 61, temperature 98 F (36.7 C), temperature source Oral, resp. rate 20, height 5\' 2"  (1.575 m), weight 172 lb 9.6 oz (78.3 kg). Wt Readings from Last 3 Encounters:  09/17/17 172 lb 9.6 oz (78.3 kg)  03/13/17 182 lb (82.6 kg)  11/14/16 177 lb 12.8 oz (80.6 kg)    PHYSICAL EXAMINATION WD female in NAD Neck  Supple without any enlargements. No maxillary or frontal sinus discomfort with pressure. Lymph node survey. No cervical supraclavicular cervical or inguinal adenopathy Cardiovascular  Pulse normal rate, regularity and rhythm. Lungs  Clear to auscultation bilateraly,  Psychiatry  Alert and oriented appropriate mood and affect Back No CVA tenderness Abdomen  Normoactive bowel sounds, abdomen soft, non-tender and obese.  Midline surgical incision intact without evidence of hernia.  Genito Urinary  Vulva/vagina: Normal external female genitalia.  No lesions. Pap taken  Bladder/urethra:  No lesions or masses  Cervix The cervix is flush with the vaginal vault, no lesions or irregularities. No RV septal nodularity or parametrial induration.      Thereasa Solo, MD .

## 2017-09-17 ENCOUNTER — Encounter: Payer: Self-pay | Admitting: Gynecologic Oncology

## 2017-09-17 ENCOUNTER — Inpatient Hospital Stay: Payer: Self-pay | Attending: Gynecologic Oncology | Admitting: Gynecologic Oncology

## 2017-09-17 VITALS — BP 119/80 | HR 61 | Temp 98.0°F | Resp 20 | Ht 62.0 in | Wt 172.6 lb

## 2017-09-17 DIAGNOSIS — Z9221 Personal history of antineoplastic chemotherapy: Secondary | ICD-10-CM | POA: Insufficient documentation

## 2017-09-17 DIAGNOSIS — Z8541 Personal history of malignant neoplasm of cervix uteri: Secondary | ICD-10-CM | POA: Insufficient documentation

## 2017-09-17 DIAGNOSIS — C531 Malignant neoplasm of exocervix: Secondary | ICD-10-CM

## 2017-09-17 DIAGNOSIS — Z923 Personal history of irradiation: Secondary | ICD-10-CM | POA: Insufficient documentation

## 2017-09-17 DIAGNOSIS — Z08 Encounter for follow-up examination after completed treatment for malignant neoplasm: Secondary | ICD-10-CM | POA: Insufficient documentation

## 2017-09-17 NOTE — Patient Instructions (Signed)
Please return to see Dr Sondra Come in January, and then return once a year to see Dr Sondra Come in radiation oncology or Dr Skeet Latch in Gynecologic Oncology.

## 2017-12-08 ENCOUNTER — Other Ambulatory Visit (HOSPITAL_COMMUNITY): Payer: Self-pay | Admitting: *Deleted

## 2017-12-08 DIAGNOSIS — Z1231 Encounter for screening mammogram for malignant neoplasm of breast: Secondary | ICD-10-CM

## 2018-03-05 ENCOUNTER — Encounter (HOSPITAL_COMMUNITY): Payer: Self-pay | Admitting: *Deleted

## 2018-03-05 ENCOUNTER — Ambulatory Visit (HOSPITAL_COMMUNITY)
Admission: RE | Admit: 2018-03-05 | Discharge: 2018-03-05 | Disposition: A | Discharge status: Home / Self Care | Payer: Self-pay | Source: Ambulatory Visit | Attending: Obstetrics and Gynecology | Admitting: Obstetrics and Gynecology

## 2018-03-05 ENCOUNTER — Encounter (HOSPITAL_COMMUNITY): Payer: Self-pay

## 2018-03-05 ENCOUNTER — Ambulatory Visit
Admission: RE | Admit: 2018-03-05 | Discharge: 2018-03-05 | Disposition: A | Discharge status: Home / Self Care | Payer: No Typology Code available for payment source | Source: Ambulatory Visit | Attending: Obstetrics and Gynecology | Admitting: Obstetrics and Gynecology

## 2018-03-05 VITALS — BP 124/70 | Ht 63.0 in | Wt 178.0 lb

## 2018-03-05 DIAGNOSIS — Z1239 Encounter for other screening for malignant neoplasm of breast: Secondary | ICD-10-CM

## 2018-03-05 DIAGNOSIS — Z1231 Encounter for screening mammogram for malignant neoplasm of breast: Secondary | ICD-10-CM

## 2018-03-05 NOTE — Patient Instructions (Signed)
Explained breast self awareness with [name]. Patient did not need a Pap smear today due to last Pap smear was 03/13/2017. Patient is being followed by the [facility] for her Pap smears. Referred patient to the [facility] for a screening mammogram. Appointment scheduled for Thursday, March 05, 2018 at 1250. Patient aware of appointment and will be there. Let patient know the [facility] will follow up with her within the next couple weeks with results of mammogram by letter or phone. Diane King verbalized understanding.  [name], Arvil Chaco, RN 12:06 PM

## 2018-03-05 NOTE — Progress Notes (Signed)
No complaints today.   Pap Smear: Pap smear not completed today. Last Pap smear was 03/13/2017 at the [facility] and normal. Patient has a history of and abnormal Pap smear 12/21/2012 that was ASCUS with positive HPV and cervical cancer. Patient is followed by GYN Oncology at the [facility]. Pap smear results since 2014 are in EPIC.  Physical exam: Breasts Breasts symmetrical. No skin abnormalities bilateral breasts. No nipple retraction right breast. Left nipple inverted that per patient is normal for her. Documented in BCCCP assessment in 2016. No nipple discharge bilateral breasts. No lymphadenopathy. No lumps palpated bilateral breasts. No complaints of pain or tenderness on exam. Referred patient to the [facility] for a screening mammogram. Appointment scheduled for Thursday, March 05, 2018 at 1250.        Pelvic/Bimanual No Pap smear completed today since last Pap smear was 03/13/2017. Pap smear not indicated per BCCCP guidelines.   Smoking History: Patient has never smoked.  Patient Navigation: Patient education provided. Access to services provided for patient through [facility] program. Spanish interpreter provided.   Colorectal Cancer Screening: Per patient has never had a colonoscopy completed. No complaints today. FIT Test given to patient to complete and return to BCCCP.  Breast and Cervical Cancer Risk Assessment: Patient has a family history of her sister having breast cancer. Patient has no known genetic mutations or history of radiation treatment to the chest before age 69. Patient has a history of cervical cancer. Patient has no history of being immunocompromised or DES exposure in-utero.   Risk Assessment    Risk Scores      03/05/2018   Last edited by: Loletta Parish, RN   5-year risk: 1.5 %   Lifetime risk: 10.7 %         Used Spanish interpreter Rudene Anda from [place].

## 2018-03-09 ENCOUNTER — Ambulatory Visit: Payer: Self-pay | Admitting: Radiation Oncology

## 2018-04-20 ENCOUNTER — Ambulatory Visit
Admission: RE | Admit: 2018-04-20 | Discharge: 2018-04-20 | Disposition: A | Discharge status: Home / Self Care | Payer: No Typology Code available for payment source | Source: Ambulatory Visit | Attending: Radiation Oncology | Admitting: Radiation Oncology

## 2018-04-20 ENCOUNTER — Other Ambulatory Visit: Payer: Self-pay

## 2018-04-20 ENCOUNTER — Encounter: Payer: Self-pay | Admitting: Radiation Oncology

## 2018-04-20 ENCOUNTER — Other Ambulatory Visit (HOSPITAL_COMMUNITY)
Admission: RE | Admit: 2018-04-20 | Discharge: 2018-04-20 | Disposition: A | Discharge status: Home / Self Care | Payer: No Typology Code available for payment source | Source: Ambulatory Visit | Attending: Radiation Oncology | Admitting: Radiation Oncology

## 2018-04-20 DIAGNOSIS — Z8541 Personal history of malignant neoplasm of cervix uteri: Secondary | ICD-10-CM | POA: Insufficient documentation

## 2018-04-20 DIAGNOSIS — C531 Malignant neoplasm of exocervix: Secondary | ICD-10-CM | POA: Insufficient documentation

## 2018-04-20 DIAGNOSIS — Z923 Personal history of irradiation: Secondary | ICD-10-CM | POA: Insufficient documentation

## 2018-04-20 DIAGNOSIS — Z9221 Personal history of antineoplastic chemotherapy: Secondary | ICD-10-CM | POA: Insufficient documentation

## 2018-04-20 DIAGNOSIS — M25511 Pain in right shoulder: Secondary | ICD-10-CM | POA: Insufficient documentation

## 2018-04-20 NOTE — Progress Notes (Signed)
Pt presents today for f/u with Dr. Sondra Come. Pt is accompanied by hospital interpreter, [name]. Pt reports muscle pain in right shoulder. Pt denies dysuria/hematuria. Pt denies vaginal bleeding/discharge. Pt denies rectal bleeding, diarrhea/constipation. Pt denies abdominal bloating, N/V.   BP 110/81 (BP Location: Left Arm, Patient Position: Sitting)   Pulse 70   Temp 98.2 F (36.8 C) (Oral)   Resp 20   Ht 5\' 3"  (1.6 m)   Wt 184 lb 3.2 oz (83.6 kg)   SpO2 100%   BMI 32.63 kg/m   Wt Readings from Last 3 Encounters:  04/20/18 184 lb 3.2 oz (83.6 kg)  03/05/18 178 lb (80.7 kg)  09/17/17 172 lb 9.6 oz (78.3 kg)   Loma Sousa, RN BSN

## 2018-04-20 NOTE — Progress Notes (Signed)
Radiation Oncology         [phone]) [phone] ________________________________  Name: Diane King MRN: [id number]  Date: 04/20/2018  DOB: 15-Sep-1965  Follow-Up Visit Note  CC: Patient, No Pcp Per  Dierdre Forth, MD    ICD-10-CM   1. Malignant neoplasm of exocervix ([facility])Chronic C53.1 Cytology - PAP    Diagnosis:  Stage IB cervical cancer, She underwent an exploratory laparotomy, but the plan for radical hysterectomy was aborted because metastatic disease was identified in the right cardinal ligament. Metastatic disease was also identified within the pelvic and periaortic nodes as such she underwent chemoradiation therapy which was completed in October 2011.  Interval Since Last Radiation:  8 years and 4 months,  cesium 137 implant completed here in November 2011  Narrative:  The patient returns today for 1 year follow-up.  She is doing well at this time. She reports some right  Shoulder stiffness/ pain. She denies pain, cough, breathing difficulties, urinary/bowel issues, vaginal bleeding/discharge, nausea, or fatigue.      Pap test collected in November of 2014 was ASC hrHPV+ Pap test collected by myself in 12/15 revealed ASCUS, hrHPV positive. The followup colposcopy and biopsy revealed atrophic tissue, no dysplasia or recurrent cancer. Pap test collected 02/28/16 was negative for intraepithelial lesions or malignancy. Benign reactive/reparative changes.  She is followed as part of the BCCCP progam. Her sister was diagnosed with metastatic breast cancer. She denies a family history of ovarian cancer. Normal screening mammogram in October 2018.  Accompanied by interpreter today.                ALLERGIES:  has No Known Allergies.  Meds: No current outpatient medications on file.   No current facility-administered medications for this encounter.     Physical Findings: The patient is in no acute distress. Patient is alert and oriented.  height is 5\' 3"  (1.6 m) and weight is 184  lb 3.2 oz (83.6 kg). Her oral temperature is 98.2 F (36.8 C). Her blood pressure is 110/81 and her pulse is 70. Her respiration is 20 and oxygen saturation is 100%. .  No palpable subclavicular or axillary adenopathy. The lungs are clear to auscultation. The heart has a regular rhythm and rate. The abdomen is soft and nontender with normal bowel sounds. No inguinal adenopathy appreciated. On pelvic examination the external genitalia are unremarkable. A speculum exam is performed. No mucosal lesions noted in the vaginal vault. The cervix is flush with the upper vaginal vault. Some radiation changes noted along the upper vaginal region. A Pap smear was obtained of the cervical region. On bimanual and rectovaginal examination there no pelvic masses appreciated. : pelvic tissues quite pliable considering she received high-dose radiation therapy with external beam and intracavitary brachytherapy treatments   Lab Findings: Lab Results  Component Value Date   WBC 3.0 (L) 06/21/2010   HGB 11.5 (L) 06/21/2010   HCT 34.5 (L) 06/21/2010   MCV 88.6 06/21/2010   PLT 199 06/21/2010    Radiographic Findings: No results found.  Impression:  No evidence of recurrence on clinical exam today. Pap smear pending.  Plan: Patient will follow-up in 1 year with either gyn/onc or radiation oncology. She will let [name] the interpretor know of her plans.   ____________________________________ -----------------------------------  Blair Promise, PhD, MD   This document serves as a record of services personally performed by Gery Pray, MD. It was created on his behalf by Mary-Margaret Loma Messing, a trained medical scribe. The creation of  this record is based on the scribe's personal observations and the provider's statements to them. This document has been checked and approved by the attending provider.

## 2018-04-24 LAB — CYTOLOGY - PAP: DIAGNOSIS: NEGATIVE

## 2018-04-28 ENCOUNTER — Telehealth: Payer: Self-pay

## 2018-04-28 NOTE — Telephone Encounter (Signed)
Coral Else, interpreter, to convey good results of Pap per Dr. Sondra Come. [name] conveyed she would relay results to pt. Loma Sousa, RN BSN       Gery Pray, MD  Loma Sousa, RN        Sharee Pimple, please inform patient of good results on pap smear. Thanks, jk   Previous Messages        Cytology - PAP  Order: [id number]  Status:  Edited Result - FINAL Visible to patient:  No (Not Released) Dx:  Malignant neoplasm of exocervix ([place])  Component 8d ago  Adequacy Satisfactory for evaluation endocervical/transformation zone component PRESENT.   Diagnosis NEGATIVE FOR INTRAEPITHELIAL LESIONS OR MALIGNANCY.   Material Submitted CervicoVaginal Pap [ThinPrep Imaged]   CYTOLOGY - PAP PAP RESULT

## 2018-08-03 ENCOUNTER — Telehealth: Payer: Self-pay | Admitting: *Deleted

## 2018-08-03 NOTE — Telephone Encounter (Signed)
eror

## 2018-12-18 ENCOUNTER — Other Ambulatory Visit (HOSPITAL_COMMUNITY): Payer: Self-pay | Admitting: *Deleted

## 2018-12-18 DIAGNOSIS — Z1231 Encounter for screening mammogram for malignant neoplasm of breast: Secondary | ICD-10-CM

## 2019-03-09 ENCOUNTER — Ambulatory Visit (HOSPITAL_COMMUNITY): Payer: Self-pay

## 2019-04-20 ENCOUNTER — Ambulatory Visit: Payer: Self-pay | Admitting: Women's Health

## 2019-04-20 ENCOUNTER — Other Ambulatory Visit: Payer: Self-pay

## 2019-04-20 ENCOUNTER — Ambulatory Visit
Admission: RE | Admit: 2019-04-20 | Discharge: 2019-04-20 | Disposition: A | Discharge status: Home / Self Care | Payer: No Typology Code available for payment source | Source: Ambulatory Visit | Attending: Obstetrics and Gynecology | Admitting: Obstetrics and Gynecology

## 2019-04-20 VITALS — BP 124/77 | Temp 97.1°F | Wt 183.0 lb

## 2019-04-20 DIAGNOSIS — Z1239 Encounter for other screening for malignant neoplasm of breast: Secondary | ICD-10-CM

## 2019-04-20 DIAGNOSIS — Z1231 Encounter for screening mammogram for malignant neoplasm of breast: Secondary | ICD-10-CM

## 2019-04-20 NOTE — Patient Instructions (Signed)
Autoexamen de [employer] Breast Self-Awareness Autoexaminarse las mamas significa familiarizarse con el aspecto y la sensacin de las mamas al tacto. Incluye revisarse las mamas habitualmente e informarle al mdico acerca de cualquier cambio. Es importante autoexaminarse las [place]. En ocasiones, los cambios pueden no ser perjudiciales (son benignos), pero a veces un cambio en las mamas puede ser un signo de un problema mdico grave. Es importante aprender a Teacher, music procedimiento de modo correcto para que pueda [employer] [facility] problemas de [place] temprana, cuando es ms probable que el tratamiento resulte exitoso. Todas las mujeres deben autoexaminarse las [place], incluso aquellas que se sometieron a implantes mamarios. Lo que necesita:  Un espejo.  Una habitacin bien iluminada. Cmo realizar el autoexamen de mamas Un autoexamen de mamas es una forma de aprender qu es normal para sus mamas y si sufren modificaciones. Para hacer un autoexamen de las mamas: Busque cambios  1. Qutese toda la ropa por encima de la cintura. 2. Prese frente a un espejo en una habitacin con buena iluminacin. 3. Apoye las manos en las caderas. 4. Empuje con fuerza hacia abajo con las manos. 5. [place] en el espejo. Busque diferencias entre ellas (asimetra), por ejemplo: ? Diferencias en la forma. ? Diferencias en el tamao. ? Pliegues, depresiones y ndulos en [place], y no en la otra. 6. Observe cada mama para buscar cambios en la piel, por ejemplo: ? Enrojecimiento. ? Zonas escamosas. 7. Observe si hay cambios en los pezones, por ejemplo: ? Secrecin. ? Dieterich. ? Hoyuelos. ? Enrojecimiento. ? Un cambio en la posicin. [place] si hay cambios [place] las mamas con cuidado para detectar ndulos y [place]. Lo mejor es hacerlo mientras est acostada boca arriba en el piso y nuevamente mientras est sentada o de pie en la ducha o la baera con agua jabonosa en la piel. Plpese cada mama de la  siguiente forma: 1. Coloque el brazo del lado de la mama que se examina por arriba de la cabeza. 2. Plpese la mama con la Liliana C[place]. 3. Comience en la zona del pezn y haga crculos superpuestos de de pulgada (2cm). Para hacerlo, use las yemas de los tres dedos del [place]. Ejerza una presin [place], luego mediana y [place]. La presin Industrial/product designer el tejido ms cercano a la piel. La presin mediana le permitir palpar el tejido que est un poco ms profundo. La presin Advertising account executive el tejido ms cercano a las costillas. 4. Continuar superponiendo crculos y vaya hacia abajo, hasta sentir las [place], por debajo del [place]. 5. Desplcese a una distancia del ancho de un dedo hacia el centro del cuerpo. Siga con los crculos superpuestos de de pulgada (2cm) para palpar la mama, mientras asciende lentamente hacia la clavcula. 6. Contine con el examen hacia arriba y [place] abajo con las tres presiones, Librarian, academic a Insurance risk surveyor.  Anote sus hallazgos Anotar lo que encuentra puede ayudarla a recordar qu debe consultar con el mdico. [place] los siguientes datos:  Qu es normal para cada mama.  Cualquier cambio que encuentre en cada mama, por ejemplo: ? La clase de cambios que encuentra. ? Dolor o sensibilidad. ? Si hay bultos, su tamao y [place].  En qu momento se encuentra del ciclo menstrual, si usted todava est menstruando. Recomendaciones y consejos generales  Examnese las [employer].  Si est amamantando, el mejor momento para examinarse las mamas es despus de Economist o de usar un Engineer, petroleum.  Liana Gerold,  el mejor momento para examinarse las mamas es 5 a 7das despus del perodo menstrual. Durante el perodo menstrual, las mamas en general tienen ms bultos, y tal vez sea ms difcil percibir los [place].  Con el tiempo y Designer, jewellery, se familiarizar con las variaciones de las mamas y se sentir ms cmoda con Visual merchandiser.  Comunquese con un mdico si:  Observa un cambio en la forma o el tamao de las mamas o los pezones.  Observa un cambio en la piel de las mamas o los pezones, como la piel enrojecida o escamosa.  Tiene una secrecin anormal proveniente de los pezones.  Encuentra un ndulo o una zona engrosada que no tena antes.  Tiene dolor en las [place].  Tiene alguna inquietud relacionada con la salud de la mama. Resumen  El autoexamen de mamas incluye buscar cambios fsicos en las [place], y tambin palpar para Actuary cambio en las mamas.  El autoexamen de mamas debe hacerse frente a un espejo en una habitacin bien iluminada.  Debe examinarse las [employer]. Si menstra, el mejor momento para examinarse las [place] es de 5 a 7das despus del perodo menstrual.  Informe al mdico si nota cambios en las mamas, como cambios en el tamao, cambios en la piel, dolor o sensibilidad, o un lquido inusual que sale de los pezones. Esta informacin no tiene Marine scientist el consejo del mdico. Asegrese de hacerle al mdico cualquier pregunta que tenga. Document Revised: 10/28/2017 Document Reviewed: 10/28/2017 Elsevier Patient Education  [place].

## 2019-04-20 NOTE — Progress Notes (Signed)
[name] is a 54 y.o. female who presents to [facility] clinic today with no complaints.    Pap Smear: Pap not smear completed today. Last Pap smear was 04/2018 at [facility] clinic and was normal. Per patient has history of an abnormal Pap smear and cervical cancer and is being followed by GYN Oncology at the [facility], next appt 04/27/2019. Last Pap smear result is available in Epic.   Physical exam: Breasts Breasts symmetrical. No skin abnormalities bilateral breasts. Nipple retraction bilateral breasts, more exaggerated on left side than right, patient reports this has been lifelong and denies any new changes. No nipple discharge bilateral breasts. No lymphadenopathy. No lumps palpated bilateral breasts.       Pelvic/Bimanual Pap is not indicated today    Smoking History: Patient has never smoked notreferred to quit line.    Patient Navigation: Patient education provided. Access to services provided for patient through [facility] program. Spanish interpreter provided. Yes transportation provided   Colorectal Cancer Screening: Per patient has never had colonoscopy completed No complaints today.    Breast and Cervical Cancer Risk Assessment: Patient has family history of breast cancer (sister passed away from breast cancer), no known genetic mutations, or radiation treatment to the chest before age 18. Patient does not have history of cervical dysplasia, immunocompromised, or DES exposure in-utero.  Risk Assessment    Risk Scores      04/20/2019 03/05/2018   Last edited by: Demetrius Revel, LPN [name], Heath Gold, RN   5-year risk: 1.5 % 1.5 %   Lifetime risk: 10.5 % 10.7 %          A: BCCCP exam without pap smear Complaint of none.  P: Referred patient to the [facility] for a screening mammogram. Appointment scheduled 04/20/2019.  Zaeda Mcferran, Gerrie Nordmann, NP 04/20/2019 9:45 AM

## 2019-04-27 ENCOUNTER — Inpatient Hospital Stay: Payer: No Typology Code available for payment source | Admitting: Gynecologic Oncology

## 2019-06-04 ENCOUNTER — Other Ambulatory Visit (HOSPITAL_COMMUNITY)
Admission: RE | Admit: 2019-06-04 | Discharge: 2019-06-04 | Disposition: A | Discharge status: Home / Self Care | Payer: No Typology Code available for payment source | Source: Ambulatory Visit | Attending: Gynecologic Oncology | Admitting: Gynecologic Oncology

## 2019-06-04 ENCOUNTER — Encounter: Payer: Self-pay | Admitting: Gynecologic Oncology

## 2019-06-04 ENCOUNTER — Encounter (INDEPENDENT_AMBULATORY_CARE_PROVIDER_SITE_OTHER): Payer: Self-pay

## 2019-06-04 ENCOUNTER — Inpatient Hospital Stay: Payer: Self-pay | Attending: Gynecologic Oncology | Admitting: Gynecologic Oncology

## 2019-06-04 ENCOUNTER — Other Ambulatory Visit: Payer: Self-pay

## 2019-06-04 VITALS — BP 108/66 | Temp 98.2°F | Resp 17 | Ht 63.0 in | Wt 184.0 lb

## 2019-06-04 DIAGNOSIS — Z90722 Acquired absence of ovaries, bilateral: Secondary | ICD-10-CM | POA: Insufficient documentation

## 2019-06-04 DIAGNOSIS — Z8541 Personal history of malignant neoplasm of cervix uteri: Secondary | ICD-10-CM | POA: Insufficient documentation

## 2019-06-04 DIAGNOSIS — Z08 Encounter for follow-up examination after completed treatment for malignant neoplasm: Secondary | ICD-10-CM | POA: Insufficient documentation

## 2019-06-04 DIAGNOSIS — C531 Malignant neoplasm of exocervix: Secondary | ICD-10-CM

## 2019-06-04 DIAGNOSIS — Z923 Personal history of irradiation: Secondary | ICD-10-CM | POA: Insufficient documentation

## 2019-06-04 DIAGNOSIS — Z9221 Personal history of antineoplastic chemotherapy: Secondary | ICD-10-CM | POA: Insufficient documentation

## 2019-06-04 NOTE — Patient Instructions (Signed)
Dr Denman George performed a pap test today. She will test it for cancer or precancer changes and evaluate for the presence of the HPV virus. If the pap test is normal and the virus is no longer present, you do not need to return to the cancer center for a repeat pap test until 2026 (5 years from now).

## 2019-06-04 NOTE — Progress Notes (Signed)
GYNECOLOGIC ONCOLOGY OFFICE VISIT  Diane King 54 y.o. female     CC:  Hx of cervical cancer The patient was seen and counseled with an interpretor.  CHIEF COMPLAINT: Surveillance for stage IB cervical cancer.   Assessment:  This is a 54 y.o. 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  Multiple paps during surveillance demonstrated atypical squamous cells. Colpo and biopsies negative. 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   No evidence for recurrence on exam.  Plan Follow-up today's pap. If cytologically normal, and HPV absent, recommend follow-up in 5 years. If cytologically normal and HPV present, recommend follow-up in 1 year. If cytologically abnormal, will consider colposcopy pending results.    HISTORY OF PRESENT ILLNESS: This is a 54 y.o. 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  INTERVAL NOTE  Diane King is doing well.  Is using herbal life and he feels as if she has more energy and overall feels much better.  Pap test collected in November of 2014 was ASC hrHPV+ Pap test collected by Dr Sondra Come in 12/15 revealed ASCUS, hrHPV positive. The followup colposcopy and biopsy revealed atrophic tissue, no dysplasia or recurrent cancer. She is doing well with no specific complaints. She is sexually active. Her sister has just been diagnosed with metastatic breast cancer. She has no symptoms concerning for recurrence.  Pap test in January, 2019 with Dr Sondra Come was normal. HPV testing not performed.   SOCIAL HISTORY:  Social History   Socioeconomic History  .  Marital status: Single    Spouse name: Not on file  . Number of children: 3  . Years of education: Not on file  . Highest education level: 6th grade  Occupational History    Employer: UNEMPLOYED    Comment: widowed  Tobacco Use  . Smoking status: Never Smoker  . Smokeless tobacco: Never Used  Substance and Sexual Activity  . Alcohol use: No  . Drug use: No    Comment: Patient info given.  Pt. denies smoking   . Sexual activity: Yes    Birth control/protection: None  Other Topics Concern  . Not on file  Social History Narrative  . Not on file   Social Determinants of Health   Financial Resource Strain:   . Difficulty of Paying Living Expenses:   Food Insecurity:   . Worried About Charity fundraiser in the Last Year:   . Arboriculturist in the Last Year:   Transportation Needs: Unmet Transportation Needs  . Lack of Transportation (Medical): Yes  . Lack of Transportation (Non-Medical): Yes  Physical Activity:   . Days of Exercise per Week:   . Minutes of Exercise per Session:   Stress:   . Feeling of Stress :   Social Connections:   . Frequency of Communication with Friends and Family:   . Frequency of Social Gatherings with Friends and Family:   . Attends Religious Services:   . Active Member of Clubs or Organizations:   . Attends Archivist Meetings:   Marland Kitchen Marital Status:   Intimate Partner Violence:   . Fear of Current or Ex-Partner:   .  Emotionally Abused:   Marland Kitchen Physically Abused:   . Sexually Abused:   Is not employed  PAST SURGICAL HISTORY  Past Surgical History:  Procedure Laterality Date  . BILATERAL SALPINGOOPHORECTOMY  2011   Bilateral pelvic lymph node dissection    PAST MEDICAL HISTORY  Past Medical History:  Diagnosis Date  . Cervical cancer ([place]) 07-27-2009 CLINICAL IB1   POORLY DIFFERENTIATED SQUAMOUS CELL CARCINOMA  . History of chemotherapy 11/15/09   Cisplatin 4 cycles weekly completed 11/15/09  . Hx of radiation therapy  10/10/09-11/14/09   cervical   . Hx of radiation therapy 12/14/09-12/17/09   cesium implant intracavity    FAMILY HISTORY  Family History  Problem Relation Age of Onset  . Breast cancer Sister   . Head & neck cancer Mother     REVIEW OF SYSTEMS Constitutional  Feels well,  Cardiovascular  No chest pain, shortness of breath, or edema  Pulmonary  Occasional  cough  Productive of white/yellow sputum. No wheeze.  Gastro Intestinal  No nausea, vomitting, or diarrhoea. No bright red blood per rectum, no abdominal pain, change in bowel movement, or constipation.  Genito Urinary  No frequency, urgency, dysuria, no bleeding or discharge.  Musculo Skeletal  No myalgia, arthralgia, joint swelling or pain  Neurologic  No weakness, numbness, change in gait,  Psychology  No depression, anxiety, insomnia.     Vitals:  Blood pressure 108/66, temperature 98.2 F (36.8 C), temperature source Temporal, resp. rate 17, height 5\' 3"  (1.6 m), weight 184 lb (83.5 kg), SpO2 100 %. Wt Readings from Last 3 Encounters:  06/04/19 184 lb (83.5 kg)  04/20/19 183 lb (83 kg)  04/20/18 184 lb 3.2 oz (83.6 kg)    PHYSICAL EXAMINATION WD female in NAD Neck  Supple without any enlargements. No maxillary or frontal sinus discomfort with pressure. Lymph node survey. No cervical supraclavicular cervical or inguinal adenopathy Cardiovascular  Pulse normal rate, regularity and rhythm. Lungs  Clear to auscultation bilateraly,  Psychiatry  Alert and oriented appropriate mood and affect Back No CVA tenderness Abdomen  Normoactive bowel sounds, abdomen soft, non-tender and obese.  Midline surgical incision intact without evidence of hernia.  Genito Urinary  Vulva/vagina: Normal external female genitalia.  No lesions. Pap taken  Bladder/urethra:  No lesions or masses  Cervix The cervix is flush with the vaginal vault, no lesions or irregularities. No RV septal nodularity or parametrial induration.    Thereasa Solo, MD .

## 2019-06-08 LAB — CYTOLOGY - PAP
Comment: NEGATIVE
Diagnosis: REACTIVE
High risk HPV: NEGATIVE

## 2019-06-10 ENCOUNTER — Telehealth: Payer: Self-pay

## 2019-06-10 NOTE — Telephone Encounter (Signed)
[name] will inform Ms Diane King that her pap smear was normal. It showed radiation changes and was HPV negative per Joylene John, NP.

## 2019-10-27 ENCOUNTER — Encounter: Payer: Self-pay | Admitting: Nurse Practitioner

## 2019-10-27 ENCOUNTER — Ambulatory Visit: Payer: Self-pay | Attending: Nurse Practitioner | Admitting: Nurse Practitioner

## 2019-10-27 ENCOUNTER — Other Ambulatory Visit: Payer: Self-pay | Admitting: Nurse Practitioner

## 2019-10-27 VITALS — BP 112/86 | HR 72 | Temp 97.7°F | Ht 63.5 in | Wt 181.0 lb

## 2019-10-27 DIAGNOSIS — Z114 Encounter for screening for human immunodeficiency virus [HIV]: Secondary | ICD-10-CM

## 2019-10-27 DIAGNOSIS — Z131 Encounter for screening for diabetes mellitus: Secondary | ICD-10-CM

## 2019-10-27 DIAGNOSIS — Z7689 Persons encountering health services in other specified circumstances: Secondary | ICD-10-CM

## 2019-10-27 DIAGNOSIS — Z1159 Encounter for screening for other viral diseases: Secondary | ICD-10-CM

## 2019-10-27 DIAGNOSIS — Z23 Encounter for immunization: Secondary | ICD-10-CM

## 2019-10-27 DIAGNOSIS — Z130 Encounter for screening for diseases of the blood and blood-forming organs and certain disorders involving the immune mechanism: Secondary | ICD-10-CM

## 2019-10-27 DIAGNOSIS — Z13228 Encounter for screening for other metabolic disorders: Secondary | ICD-10-CM

## 2019-10-27 DIAGNOSIS — R7303 Prediabetes: Secondary | ICD-10-CM

## 2019-10-27 DIAGNOSIS — Z1211 Encounter for screening for malignant neoplasm of colon: Secondary | ICD-10-CM

## 2019-10-27 DIAGNOSIS — Z1329 Encounter for screening for other suspected endocrine disorder: Secondary | ICD-10-CM

## 2019-10-27 DIAGNOSIS — Z13220 Encounter for screening for lipoid disorders: Secondary | ICD-10-CM

## 2019-10-27 LAB — POCT GLYCOSYLATED HEMOGLOBIN (HGB A1C): Hemoglobin A1C: 5.6 % (ref 4.0–5.6)

## 2019-10-27 LAB — GLUCOSE, POCT (MANUAL RESULT ENTRY): POC Glucose: 107 mg/dl — AB (ref 70–99)

## 2019-10-27 NOTE — Progress Notes (Unsigned)
° °  Assessment & Plan:  Diagnoses and all orders for this visit:  Encounter to establish care  Screening for deficiency anemia -     CBC  Screening for metabolic disorder -     [id number]  Encounter for screening for diabetes mellitus -     Hemoglobin A1c  Lipid screening -     Lipid panel  Thyroid disorder screening -     TSH    Patient has been counseled on age-appropriate routine health concerns for screening and prevention. These are reviewed and up-to-date. Referrals have been placed accordingly. Immunizations are up-to-date or declined.    Subjective:  No chief complaint on file.  HPI Diane King 54 y.o. female presents to office today to establish care. VRI was used to communicate directly with patient for the entire encounter including providing detailed patient instructions.   Currently being followed by GYN ONC: PER NOTE 06-04-2019 She has a history of stage IB cervical carcinoma treated with chemoradiation in 2011. 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 Annual PAP smears performed since that time. Per GYN: Recent PAP normal and HPV absent, recommend follow-up in 5 years.    ROS  Past Medical History:  Diagnosis Date   Cervical cancer ([place]) 07-27-2009 CLINICAL IB1   POORLY DIFFERENTIATED SQUAMOUS CELL CARCINOMA   History of chemotherapy 11/15/09   Cisplatin 4 cycles weekly completed 11/15/09   Hx of radiation therapy 10/10/09-11/14/09   cervical    Hx of radiation therapy 12/14/09-12/17/09   cesium implant intracavity    Past Surgical History:  Procedure Laterality Date   ABDOMINAL HYSTERECTOMY     BILATERAL SALPINGOOPHORECTOMY  2011   Bilateral pelvic lymph node dissection    Family History  Problem Relation Age of Onset   Breast cancer Sister    Head & neck cancer Mother    Diabetes Neg Hx    Hypertension Neg Hx       Social History Reviewed with no changes to be made today.   No outpatient medications prior to visit.   No facility-administered medications prior to visit.    No Known Allergies     Objective:    There were no vitals taken for this visit. Wt Readings from Last 3 Encounters:  10/27/19 181 lb (82.1 kg)  06/04/19 184 lb (83.5 kg)  04/20/19 183 lb (83 kg)    Physical Exam       Patient has been counseled extensively about nutrition and exercise as well as the importance of adherence with medications and regular follow-up. The patient was given clear instructions to go to ER or return to medical center if symptoms don't improve, worsen or new problems develop. The patient verbalized understanding.   Follow-up: No follow-ups on file.   Gildardo Pounds, FNP-BC [facility] and [facility] [place], [place]   10/27/2019, 9:27 AM

## 2019-10-27 NOTE — Progress Notes (Signed)
Assessment & Plan:  Diane King was seen today for establish care.  Diagnoses and all orders for this visit:  Encounter to establish care  Prediabetes -     Glucose (CBG) -     HgB A1c  Colon cancer screening -     Fecal occult blood, imunochemical([facility])  Need for hepatitis C screening test -     Hepatitis C Antibody  Encounter for screening for HIV -     HIV antibody (with reflex)  Need for immunization against influenza -     Flu Vaccine QUAD 6+ mos PF IM (Fluarix Quad PF)    Patient has been counseled on age-appropriate routine health concerns for screening and prevention. These are reviewed and up-to-date. Referrals have been placed accordingly. Immunizations are up-to-date or declined.    Subjective:   Chief Complaint  Patient presents with  . Establish Care    Pt. is here to establish care for primary care.    HPI Diane King 54 y.o. female presents to office today to establish care.  She has a history of Cervical cancer treated with chemoradiation in 2011. Most recent PAP smear in April was normal and patient was instructed per GYN to repeat next PAP in 5 years.  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   She is doing well today with no questions or concerns. BP Readings from Last 3 Encounters:  10/27/19 112/86  06/04/19 108/66  04/20/19 124/77    Prediabetes Well controlled without the use of oral diabetic agents.  Lab Results  Component Value Date   HGBA1C 5.6 10/27/2019    Review of Systems  Constitutional: Negative for fever, malaise/fatigue and weight loss.  HENT: Negative.  Negative for nosebleeds.   Eyes: Negative.  Negative for blurred vision, double vision and photophobia.  Respiratory: Negative.  Negative for cough and shortness of breath.   Cardiovascular: Negative.  Negative for chest pain, palpitations  and leg swelling.  Gastrointestinal: Negative.  Negative for heartburn, nausea and vomiting.  Musculoskeletal: Negative.  Negative for myalgias.  Neurological: Negative.  Negative for dizziness, focal weakness, seizures and headaches.  Psychiatric/Behavioral: Negative.  Negative for suicidal ideas.    Past Medical History:  Diagnosis Date  . Cervical cancer ([place]) 07-27-2009 CLINICAL IB1   POORLY DIFFERENTIATED SQUAMOUS CELL CARCINOMA  . History of chemotherapy 11/15/09   Cisplatin 4 cycles weekly completed 11/15/09  . Hx of radiation therapy 10/10/09-11/14/09   cervical   . Hx of radiation therapy 12/14/09-12/17/09   cesium implant intracavity    Past Surgical History:  Procedure Laterality Date  . BILATERAL SALPINGOOPHORECTOMY  2011   Bilateral pelvic lymph node dissection    Family History  Problem Relation Age of Onset  . Breast cancer Sister   . Head & neck cancer Mother   . Diabetes Neg Hx   . Hypertension Neg Hx     Social History Reviewed with no changes to be made today.   No outpatient medications prior to visit.   No facility-administered medications prior to visit.    No Known Allergies     Objective:    BP 112/86 (BP Location: Right Arm, Patient Position: Sitting, Cuff Size: Normal)   Pulse 72   Temp 97.7 F (36.5 C) (Temporal)   Ht 5' 3.5" (1.613 m)   Wt 181 lb (82.1 kg)   SpO2 100%   BMI 31.56 kg/m  Wt Readings from Last 3 Encounters:  10/27/19 181 lb (82.1 kg)  06/04/19 184 lb (83.5 kg)  04/20/19 183 lb (83 kg)    Physical Exam Vitals and nursing note reviewed.  Constitutional:      Appearance: She is well-developed.  HENT:     Head: Normocephalic and atraumatic.  Cardiovascular:     Rate and Rhythm: Normal rate and regular rhythm.     Heart sounds: Normal heart sounds. No murmur heard.  No friction rub. No gallop.   Pulmonary:     Effort: Pulmonary effort is normal. No tachypnea or respiratory distress.     Breath sounds: Normal breath  sounds. No decreased breath sounds, wheezing, rhonchi or rales.  Chest:     Chest wall: No tenderness.  Abdominal:     General: Bowel sounds are normal.     Palpations: Abdomen is soft.  Musculoskeletal:        General: Normal range of motion.     Cervical back: Normal range of motion.  Skin:    General: Skin is warm and dry.  Neurological:     Mental Status: She is alert and oriented to person, place, and time.     Coordination: Coordination normal.  Psychiatric:        Behavior: Behavior normal. Behavior is cooperative.        Thought Content: Thought content normal.        Judgment: Judgment normal.          Patient has been counseled extensively about nutrition and exercise as well as the importance of adherence with medications and regular follow-up. The patient was given clear instructions to go to ER or return to medical center if symptoms don't improve, worsen or new problems develop. The patient verbalized understanding.   Follow-up: Return in about 6 months (around 04/25/2020).   Gildardo Pounds, FNP-BC [facility] and [facility] [place], [place]   10/30/2019, 9:47 AM

## 2019-10-28 LAB — HEPATITIS C ANTIBODY: Hep C Virus Ab: <0.1 s/co ratio (ref 0.0–0.9)

## 2019-10-28 LAB — HIV ANTIBODY (ROUTINE TESTING W REFLEX): HIV Screen 4th Generation wRfx: NONREACTIVE

## 2019-10-30 ENCOUNTER — Encounter: Payer: Self-pay | Admitting: Nurse Practitioner

## 2019-11-09 ENCOUNTER — Other Ambulatory Visit: Payer: No Typology Code available for payment source

## 2019-11-09 ENCOUNTER — Other Ambulatory Visit: Payer: Self-pay | Admitting: Nurse Practitioner

## 2019-11-09 DIAGNOSIS — Z13220 Encounter for screening for lipoid disorders: Secondary | ICD-10-CM

## 2019-11-09 DIAGNOSIS — Z1329 Encounter for screening for other suspected endocrine disorder: Secondary | ICD-10-CM

## 2019-11-09 DIAGNOSIS — Z130 Encounter for screening for diseases of the blood and blood-forming organs and certain disorders involving the immune mechanism: Secondary | ICD-10-CM

## 2019-11-09 DIAGNOSIS — Z13228 Encounter for screening for other metabolic disorders: Secondary | ICD-10-CM

## 2019-11-09 LAB — CMP14+EGFR

## 2019-11-09 LAB — LIPID PANEL

## 2019-11-09 LAB — TSH

## 2019-11-09 LAB — SPECIMEN STATUS REPORT

## 2020-03-31 ENCOUNTER — Telehealth: Payer: Self-pay | Admitting: Nurse Practitioner

## 2020-03-31 NOTE — Telephone Encounter (Signed)
Called patient using interpreter services advising her that unfortunately her appointment for March 17th had been cancelled due to the provider being out of the office. Advised patient to call [phone] to reschedule.

## 2020-04-27 ENCOUNTER — Ambulatory Visit: Payer: Self-pay | Admitting: Nurse Practitioner

## 2020-05-01 ENCOUNTER — Telehealth: Payer: Self-pay | Admitting: *Deleted

## 2020-05-01 NOTE — Telephone Encounter (Signed)
Explained to [name] that per Dr Serita Grit note in April 2021 patient's pap smear was normal and she didn't need another one until 2026. Explained that the patient can either have her PCP or regular GYN perform the test. [name] will give the patient the message

## 2020-06-07 ENCOUNTER — Telehealth: Payer: Self-pay | Admitting: Physician Assistant

## 2020-07-13 ENCOUNTER — Ambulatory Visit: Payer: Self-pay | Admitting: Physician Assistant

## 2020-08-17 ENCOUNTER — Ambulatory Visit: Payer: Self-pay | Admitting: Physician Assistant

## 2020-12-13 ENCOUNTER — Other Ambulatory Visit: Payer: Self-pay

## 2020-12-13 DIAGNOSIS — Z1231 Encounter for screening mammogram for malignant neoplasm of breast: Secondary | ICD-10-CM

## 2020-12-19 ENCOUNTER — Encounter: Payer: Self-pay | Admitting: Physician Assistant

## 2020-12-19 ENCOUNTER — Other Ambulatory Visit: Payer: Self-pay

## 2020-12-19 ENCOUNTER — Ambulatory Visit: Payer: Self-pay | Attending: Physician Assistant | Admitting: Physician Assistant

## 2020-12-19 VITALS — BP 127/85 | HR 68 | Temp 98.7°F | Resp 18 | Ht 63.0 in | Wt 166.0 lb

## 2020-12-19 DIAGNOSIS — R7303 Prediabetes: Secondary | ICD-10-CM

## 2020-12-19 DIAGNOSIS — E782 Mixed hyperlipidemia: Secondary | ICD-10-CM

## 2020-12-19 DIAGNOSIS — L309 Dermatitis, unspecified: Secondary | ICD-10-CM

## 2020-12-19 DIAGNOSIS — Z13220 Encounter for screening for lipoid disorders: Secondary | ICD-10-CM

## 2020-12-19 DIAGNOSIS — Z923 Personal history of irradiation: Secondary | ICD-10-CM

## 2020-12-19 DIAGNOSIS — R7989 Other specified abnormal findings of blood chemistry: Secondary | ICD-10-CM

## 2020-12-19 DIAGNOSIS — E559 Vitamin D deficiency, unspecified: Secondary | ICD-10-CM

## 2020-12-19 DIAGNOSIS — N6312 Unspecified lump in the right breast, upper inner quadrant: Secondary | ICD-10-CM

## 2020-12-19 DIAGNOSIS — Z6829 Body mass index (BMI) 29.0-29.9, adult: Secondary | ICD-10-CM

## 2020-12-19 LAB — POCT GLYCOSYLATED HEMOGLOBIN (HGB A1C): Hemoglobin A1C: 5.9 % — AB (ref 4.0–5.6)

## 2020-12-19 MED ORDER — TRIAMCINOLONE ACETONIDE 0.1 % EX CREA: 1.0000 "application " | TOPICAL_CREAM | Freq: Two times a day (BID) | CUTANEOUS | 0 refills | Status: DC

## 2020-12-19 NOTE — Progress Notes (Signed)
Established Patient Office Visit  Subjective:  Patient ID: Diane King, female    DOB: Oct 22, 1965  Age: 55 y.o. MRN: [id number]  CC:  Chief Complaint  Patient presents with   Prediabetes    HPI Diane King presents for a health check up with a few complaints.  States that she started having a spot on her right lower leg near her knee that has been itchy, states that it has turned black, has tried an ointment for itching without relief. Denies bleeding,   States that she noticed a small lump on her right breast, two weeks ago, denies tenderness.   Due to language barrier, an interpreter was present during the history-taking and subsequent discussion (and for part of the physical exam) with this patient.   Past Medical History:  Diagnosis Date   Cervical cancer ([place]) 07-27-2009 CLINICAL IB1   POORLY DIFFERENTIATED SQUAMOUS CELL CARCINOMA   History of chemotherapy 11/15/09   Cisplatin 4 cycles weekly completed 11/15/09   Hx of radiation therapy 10/10/09-11/14/09   cervical    Hx of radiation therapy 12/14/09-12/17/09   cesium implant intracavity    Past Surgical History:  Procedure Laterality Date   BILATERAL SALPINGOOPHORECTOMY  2011   Bilateral pelvic lymph node dissection    Family History  Problem Relation Age of Onset   Breast cancer Sister    Head & neck cancer Mother    Diabetes Neg Hx    Hypertension Neg Hx     Social History   Socioeconomic History   Marital status: Single    Spouse name: Not on file   Number of children: 3   Years of education: Not on file   Highest education level: 6th grade  Occupational History    Employer: UNEMPLOYED    Comment: widowed  Tobacco Use   Smoking status: Never   Smokeless tobacco: Never  Vaping Use   Vaping Use: Never used  Substance and Sexual Activity   Alcohol use: No   Drug use: No    Comment: Patient info given.  Pt. denies smoking    Sexual activity: Yes    Birth control/protection: None  Other  Topics Concern   Not on file  Social History Narrative   Not on file   Social Determinants of Health   Financial Resource Strain: Not on file  Food Insecurity: Not on file  Transportation Needs: Not on file  Physical Activity: Not on file  Stress: Not on file  Social Connections: Not on file  Intimate Partner Violence: Not on file    No outpatient medications prior to visit.   No facility-administered medications prior to visit.    No Known Allergies  ROS Review of Systems  Constitutional: Negative.   HENT: Negative.    Eyes: Negative.   Respiratory:  Negative for shortness of breath.   Cardiovascular:  Negative for chest pain.  Gastrointestinal: Negative.   Endocrine: Negative.   Genitourinary: Negative.   Musculoskeletal: Negative.   Skin:  Positive for rash.  Allergic/Immunologic: Negative.   Neurological: Negative.   Hematological: Negative.   Psychiatric/Behavioral: Negative.       Objective:    Physical Exam Vitals and nursing note reviewed. Exam conducted with a chaperone present.  Constitutional:      Appearance: Normal appearance.  HENT:     Head: Normocephalic and atraumatic.     Right Ear: External ear normal.     Left Ear: External ear normal.     Nose: Nose  normal.     Mouth/Throat:     Mouth: Mucous membranes are moist.     Pharynx: Oropharynx is clear.  Eyes:     Extraocular Movements: Extraocular movements intact.     Conjunctiva/sclera: Conjunctivae normal.     Pupils: Pupils are equal, round, and reactive to light.  Cardiovascular:     Rate and Rhythm: Normal rate and regular rhythm.     Pulses: Normal pulses.     Heart sounds: Normal heart sounds.  Pulmonary:     Effort: Pulmonary effort is normal.     Breath sounds: Normal breath sounds.  Chest:  Breasts:    Right: Mass present.     Left: Normal.    Musculoskeletal:        General: Normal range of motion.     Cervical back: Normal range of motion and neck supple.  Skin:     General: Skin is warm and dry.     Findings: Rash present. Rash is crusting.     Comments: Small crusting lesion noted left lower leg, near knee, non-pustular, pea sized  Neurological:     General: No focal deficit present.     Mental Status: She is alert and oriented to person, place, and time.  Psychiatric:        Mood and Affect: Mood normal.        Behavior: Behavior normal.        Thought Content: Thought content normal.        Judgment: Judgment normal.    BP 127/85 (BP Location: Left Arm, Patient Position: Sitting, Cuff Size: Normal)   Pulse 68   Temp 98.7 F (37.1 C) (Oral)   Resp 18   Ht 5' 3"  (1.6 m)   Wt 166 lb (75.3 kg)   SpO2 99%   BMI 29.41 kg/m  Wt Readings from Last 3 Encounters:  12/19/20 166 lb (75.3 kg)  10/27/19 181 lb (82.1 kg)  06/04/19 184 lb (83.5 kg)     Health Maintenance Due  Topic Date Due   COVID-19 Vaccine (1) Never done   Pneumococcal Vaccine 58-30 Years old (1 - PCV) Never done   TETANUS/TDAP  Never done   Zoster Vaccines- Shingrix (1 of 2) Never done   COLON CANCER SCREENING ANNUAL FOBT  Never done   INFLUENZA VACCINE  09/11/2020    There are no preventive care reminders to display for this patient.  Lab Results  Component Value Date   TSH 4.620 (H) 12/19/2020   Lab Results  Component Value Date   WBC 4.4 12/19/2020   HGB 12.0 12/19/2020   HCT 38.3 12/19/2020   MCV 81 12/19/2020   PLT 301 12/19/2020   Lab Results  Component Value Date   NA 141 12/19/2020   K 4.8 12/19/2020   CO2 CANCELED 10/27/2019   GLUCOSE 99 12/19/2020   BUN 11 12/19/2020   CREATININE 0.60 12/19/2020   BILITOT 0.3 12/19/2020   ALKPHOS 98 12/19/2020   AST 14 12/19/2020   ALT CANCELED 10/27/2019   PROT 7.1 12/19/2020   ALBUMIN 4.4 12/19/2020   CALCIUM 9.3 12/19/2020   EGFR 105 12/19/2020   Lab Results  Component Value Date   CHOL 206 (H) 12/19/2020   Lab Results  Component Value Date   HDL 42 12/19/2020   Lab Results  Component Value  Date   LDLCALC 128 (H) 12/19/2020   Lab Results  Component Value Date   TRIG 202 (H) 12/19/2020   Lab Results  Component Value Date   CHOLHDL 4.9 (H) 12/19/2020   Lab Results  Component Value Date   HGBA1C 5.9 (A) 12/19/2020      Assessment & Plan:   Problem List Items Addressed This Visit       Musculoskeletal and Integument   Dermatitis   Relevant Medications   triamcinolone cream (KENALOG) 0.1 %     Other   Hx of radiation therapy   Prediabetes - Primary   Relevant Orders   POCT A1C (Completed)   CBC with Differential/Platelet (Completed)   Comp. Metabolic Panel (12) (Completed)   TSH (Completed)   Vitamin D, 25-hydroxy (Completed)   Mass of upper inner quadrant of right breast   BMI 29.0-29.9,adult   Other Visit Diagnoses     Lipid screening       Relevant Orders   Lipid panel (Completed)       Meds ordered this encounter  Medications   triamcinolone cream (KENALOG) 0.1 %    Sig: Apply 1 application topically 2 (two) times daily.    Dispense:  30 g    Refill:  0    Order Specific Question:   Supervising Provider    Answer:   WRIGHT, PATRICK E [[id number]]  1. Prediabetes A1c 5.9.  Patient education given on low sugar diet.  Fasting labs completed today. - POCT A1C - CBC with Differential/Platelet - Comp. Metabolic Panel (12) - TSH - Vitamin D, 25-hydroxy  2. Mass of upper inner quadrant of right breast Patient is scheduled for mammogram on January 25, 2021.  Patient encouraged to keep appointment, patient understands and agrees.  3. Dermatitis Trial Kenalog.  Patient education given on supportive care - triamcinolone cream (KENALOG) 0.1 %; Apply 1 application topically 2 (two) times daily.  Dispense: 30 g; Refill: 0  4. Hx of radiation therapy   5. Lipid screening  - Lipid panel  6. BMI 29.0-29.9,adult   I have reviewed the patient's medical history (PMH, PSH, Social History, Family History, Medications, and allergies) , and have been  updated if relevant. I spent 30 minutes reviewing chart and  face to face time with patient.     Follow-up: Return in about 3 months (around 03/21/2021).    Loraine Grip Mayers, PA-C

## 2020-12-19 NOTE — Progress Notes (Signed)
Patient has not eaten or taken medication today, Patient denies pain at this time. Patient reports scratching a spot on her right calf that now has a black appearance.

## 2020-12-19 NOTE — Patient Instructions (Signed)
We will call you with today's lab results.  Kennieth Rad, PA-C Physician Assistant [facility] Mobile [facility] [email or web address]  Plan de alimentacin para personas con prediabetes Prediabetes Eating Plan La prediabetes es una afeccin que hace que los niveles de azcar en la sangre (glucosa) sean ms altos de lo normal. Esto aumenta el riesgo de desarrollar diabetes tipo 2 (diabetes mellitus tipo 2). Trabajar con un profesional de la salud o especialista en nutricin (nutricionista) para Teacher, English as a foreign language en la dieta y el estilo de vida puede ayudar a prevenir el inicio de la diabetes. Estos cambios pueden ayudarlo a: [employer] de glucemia. Mejorar los niveles de [place]. Controlar la presin arterial. Consejos para seguir [place] [employer] plan Al leer las etiquetas de los alimentos Lea las etiquetas de los alimentos envasados para controlar la cantidad de grasa, sal (sodio) y azcar que contienen. Evite los alimentos que contengan lo siguiente: Grasas saturadas. Grasas trans. Azcares agregados. Evite los alimentos que contengan ms de 300 miligramos (mg) de sodio por porcin. Limite el consumo de sodio a menos de 2300 mg por da. Al ir de compras Evite comprar alimentos procesados y preelaborados. Evite comprar bebidas con azcar agregada. Al cocinar Cocine con aceite de oliva. No use mantequilla, manteca de cerdo ni mantequilla clarificada. Cocine los alimentos al horno, a la parrilla, asados, al vapor o hervidos. Evite frerlos. Planificacin de las comidas  Trabaje con el nutricionista para crear un plan de alimentacin que sea adecuado para usted. Esto puede incluir el seguimiento de cuntas caloras ingiere al da. Use un registro de alimentos, un cuaderno o una aplicacin mvil para anotar lo que comi en cada comida. Considere la posibilidad de seguir Web designer. Esta puede comprender lo siguiente: Comer varias porciones de  frutas y verduras frescas por Training and development officer. Pescado al [employer] veces por semana. Comer una porcin de cereales integrales, frijoles, frutos secos y semillas por da. Aceite de Tour manager de otras grasas. Limitar el consumo de alcohol. Limitar la carne roja. Usar productos lcteos descremados o con bajo contenido de [place]. Considerar seguir Ardelia Mems dieta a base de vegetales. Esta incluye hacer elecciones alimentarias que se concentren en comer principalmente verduras y frutas, cereales, frijoles, frutos secos y semillas. Si tiene hipertensin arterial, quizs Development worker, international aid consumo de sodio o seguir una dieta como el plan de alimentacin basado en los Enfoques Alimentarios para Detener la Hipertensin (Dietary Approaches to Stop Hypertension, DASH). La dieta DASH tiene como objetivo bajar la hipertensin arterial. Kary Kos de vida Establezca metas para bajar de peso con la ayuda de su equipo de atencin mdica. A la [employer] con prediabetes se les recomienda bajar un 7 % de su peso corporal. Sherilyn Cooter al menos 30 minutos de ejercicio, 5 o ms das a la semana. Asista a un grupo de apoyo o solicite el apoyo de un consejero de salud mental. Use los medicamentos de venta libre y los recetados solamente como se lo haya indicado el mdico. Qu alimentos se recomiendan? Lambert Mody Bayas. Bananas. Manzanas. Naranjas. Uvas. Papaya. Mango. [place]. Kiwi. Pomelo. Cerezas. [place] Valeda Malm. Espinaca. Guisantes. Remolachas. Coliflor. Repollo. Brcoli. Zanahorias. Tomates. Cal[place]aza. Augustin Coupe. Hierbas. Pimientos. Cebollas. Pepinos. Coles de Bruselas. Granos Productos integrales, como panes, [place], cereales y pastas de salvado o integrales. Avena sin azcar. Trigo burgol. Cebada. Quinua. Arroz integral. Tacos o tortillas de harina de maz o de salvado. Carnes y Psychiatric nurse. Carne de ave sin piel. Cortes magros de cerdo y carne de res. Tofu. Huevos. Frutos  secos. Frijoles. Lcteos Productos  lcteos descremados o semidescremados, como yogur, queso cottage y [place]. [employer]. T. Caf. Gaseosas sin azcar o dietticas. Soda. Leche descremada o con bajo contenido de [place]. Productos alternativos a la Alpha, como [place] de soja o de [place]. Grasas y aceites Aceite de [place]. Aceite de canola. Aceite de girasol. Aceite de semillas de uva. [place]. Nueces. Dulces y postres Pudin sin azcar o con bajo contenido de [place]. Helado y otros postres congelados sin azcar o con bajo contenido de [place]. Alios y condimentos Hierbas. Especias sin sodio. Mostaza. Salsa de pepinillos. [place] con bajo contenido de sal y de Location manager. Salsa barbacoa con bajo contenido de sal y de azcar. Mayonesa con bajo contenido de grasa o sin grasa. Es posible que los productos mencionados arriba no formen una lista completa de las bebidas o los alimentos recomendados. Consulte a un nutricionista para obtener ms informacin. Qu alimentos no se recomiendan? Lambert Mody Frutas enlatadas al almbar. Verduras Verduras enlatadas. Verduras congeladas con mantequilla o salsa de crema. Granos Productos elaborados con [place] y [place], como panes, pastas, bocadillos y cereales. Carnes y otras protenas Cortes de carne con alto contenido de Lobbyist. Carne de ave con piel. Carne empanizada o frita. Carnes procesadas. Lcteos Yogur, Planada enteros. Bebidas Bebidas azucaradas, como t helado y [place]. Grasas y [employer]. [place]. Mantequilla clarificada. Dulces y [employer], como pasteles, pastelitos, galletas dulces y tarta de [place]. Alios y condimentos Mezclas de especias con sal agregada. [place]. Salsa barbacoa. Mayonesa. Es posible que los productos que se enumeran ms arriba no sean una lista completa de los alimentos y las bebidas que no se recomiendan. Consulte a un nutricionista para obtener ms informacin. Dnde buscar ms informacin American  Diabetes Association (Asociacin Estadounidense de la Diabetes): www.diabetes.org Resumen Es posible que deba hacer cambios en la dieta y el estilo de vida para ayudar a prevenir el inicio de la diabetes. Estos cambios pueden ayudarlo a Forensic scientist, mejorar los niveles de colesterol y Aeronautical engineer presin arterial. Establezca metas para bajar de peso con la ayuda de su equipo de atencin mdica. A la [employer] con prediabetes se les recomienda bajar un 7 % de su peso corporal. Considere la posibilidad de seguir Web designer. Esto incluye comer muchas frutas y verduras frescas, cereales integrales, frijoles, frutos secos, semillas, pescado y productos lcteos con bajo contenido de [place], y usar aceite de Tour manager de otras grasas. Esta informacin no tiene Marine scientist el consejo del mdico. Asegrese de hacerle al mdico cualquier pregunta que tenga. Document Revised: 07/27/2019 Document Reviewed: 07/27/2019 Elsevier Patient Education  2022 [employer].

## 2020-12-20 DIAGNOSIS — N6312 Unspecified lump in the right breast, upper inner quadrant: Secondary | ICD-10-CM | POA: Insufficient documentation

## 2020-12-20 DIAGNOSIS — L309 Dermatitis, unspecified: Secondary | ICD-10-CM | POA: Insufficient documentation

## 2020-12-20 DIAGNOSIS — R7303 Prediabetes: Secondary | ICD-10-CM | POA: Insufficient documentation

## 2020-12-20 DIAGNOSIS — Z6829 Body mass index (BMI) 29.0-29.9, adult: Secondary | ICD-10-CM | POA: Insufficient documentation

## 2020-12-20 LAB — COMP. METABOLIC PANEL (12)
AST: 14 IU/L (ref 0–40)
Albumin/Globulin Ratio: 1.6 (ref 1.2–2.2)
Albumin: 4.4 g/dL (ref 3.8–4.9)
Alkaline Phosphatase: 98 IU/L (ref 44–121)
BUN/Creatinine Ratio: 18 (ref 9–23)
BUN: 11 mg/dL (ref 6–24)
Bilirubin Total: 0.3 mg/dL (ref 0.0–1.2)
Calcium: 9.3 mg/dL (ref 8.7–10.2)
Chloride: 104 mmol/L (ref 96–106)
Creatinine, Ser: 0.6 mg/dL (ref 0.57–1.00)
Globulin, Total: 2.7 g/dL (ref 1.5–4.5)
Glucose: 99 mg/dL (ref 70–99)
Potassium: 4.8 mmol/L (ref 3.5–5.2)
Sodium: 141 mmol/L (ref 134–144)
Total Protein: 7.1 g/dL (ref 6.0–8.5)
eGFR: 105 mL/min/{1.73_m2} (ref >59)

## 2020-12-20 LAB — CBC WITH DIFFERENTIAL/PLATELET
Basophils Absolute: 0 10*3/uL (ref 0.0–0.2)
Basos: 0 %
EOS (ABSOLUTE): 0.1 10*3/uL (ref 0.0–0.4)
Eos: 2 %
Hematocrit: 38.3 % (ref 34.0–46.6)
Hemoglobin: 12 g/dL (ref 11.1–15.9)
Immature Grans (Abs): 0 10*3/uL (ref 0.0–0.1)
Immature Granulocytes: 0 %
Lymphocytes Absolute: 1.5 10*3/uL (ref 0.7–3.1)
Lymphs: 34 %
MCH: 25.4 pg — ABNORMAL LOW (ref 26.6–33.0)
MCHC: 31.3 g/dL — ABNORMAL LOW (ref 31.5–35.7)
MCV: 81 fL (ref 79–97)
Monocytes Absolute: 0.4 10*3/uL (ref 0.1–0.9)
Monocytes: 8 %
Neutrophils Absolute: 2.5 10*3/uL (ref 1.4–7.0)
Neutrophils: 56 %
Platelets: 301 10*3/uL (ref 150–450)
RBC: 4.72 x10E6/uL (ref 3.77–5.28)
RDW: 15.9 % — ABNORMAL HIGH (ref 11.7–15.4)
WBC: 4.4 10*3/uL (ref 3.4–10.8)

## 2020-12-20 LAB — LIPID PANEL
Chol/HDL Ratio: 4.9 ratio — ABNORMAL HIGH (ref 0.0–4.4)
Cholesterol, Total: 206 mg/dL — ABNORMAL HIGH (ref 100–199)
HDL: 42 mg/dL (ref >39)
LDL Chol Calc (NIH): 128 mg/dL — ABNORMAL HIGH (ref 0–99)
Triglycerides: 202 mg/dL — ABNORMAL HIGH (ref 0–149)
VLDL Cholesterol Cal: 36 mg/dL (ref 5–40)

## 2020-12-20 LAB — VITAMIN D 25 HYDROXY (VIT D DEFICIENCY, FRACTURES): Vit D, 25-Hydroxy: 8.8 ng/mL — ABNORMAL LOW (ref 30.0–100.0)

## 2020-12-20 LAB — TSH: TSH: 4.62 u[IU]/mL — ABNORMAL HIGH (ref 0.450–4.500)

## 2020-12-20 MED ORDER — VITAMIN D (ERGOCALCIFEROL) 1.25 MG (50000 UNIT) PO CAPS: 50000.0000 [IU] | ORAL_CAPSULE | ORAL | 2 refills | Status: DC

## 2020-12-20 NOTE — Addendum Note (Signed)
Addended by: Kennieth Rad on: 12/20/2020 05:06 PM   Modules accepted: Orders

## 2020-12-21 ENCOUNTER — Telehealth: Payer: Self-pay | Admitting: *Deleted

## 2020-12-21 NOTE — Telephone Encounter (Signed)
Medical Assistant used [employer] to contact patient.  Interpreter Name: Pryor Curia #: [id number] Patient verified DOB Patient is aware of labs being normal except for cholesterol. Patient aware of following low cholesterol diet and a recheck in 6 months.

## 2020-12-21 NOTE — Telephone Encounter (Signed)
-----   Message from Kennieth Rad, [place] sent at 12/20/2020  5:06 PM EST ----- Please call patient and let her know that her kidney function and liver function are within normal limits.  She does not show signs of anemia.  Her cholesterol overall is elevated, her risk of a cardiovascular event in the next 10 years is 3.1%, she does not need to start cholesterol medication at this time, however it is very important that she follow a low-cholesterol diet and have her cholesterol rechecked in 6 months. Her thyroid was slightly abnormal, she does need to have these labs rechecked. Her vitamin D is low, she needs to take 50,000 units once a week for the next 12 weeks and have it rechecked at that time.  Prescription sent to her pharmacy.  The 10-year ASCVD risk score (Arnett DK, et al., 2019) is: 3.1%   Values used to calculate the score:     Age: 55 years     Sex: Female     Is Non-Hispanic African American: No     Diabetic: No     Tobacco smoker: No     Systolic Blood Pressure: 191 mmHg     Is BP treated: No     HDL Cholesterol: 42 mg/dL     Total Cholesterol: 206 mg/dL

## 2020-12-31 ENCOUNTER — Other Ambulatory Visit: Payer: Self-pay

## 2020-12-31 ENCOUNTER — Encounter (HOSPITAL_COMMUNITY): Payer: Self-pay

## 2020-12-31 ENCOUNTER — Ambulatory Visit (HOSPITAL_COMMUNITY)
Admission: EM | Admit: 2020-12-31 | Discharge: 2020-12-31 | Disposition: A | Discharge status: Home / Self Care | Payer: Self-pay | Attending: Physician Assistant | Admitting: Physician Assistant

## 2020-12-31 DIAGNOSIS — J0140 Acute pansinusitis, unspecified: Secondary | ICD-10-CM

## 2020-12-31 DIAGNOSIS — R519 Headache, unspecified: Secondary | ICD-10-CM

## 2020-12-31 MED ORDER — FLUTICASONE PROPIONATE 50 MCG/ACT NA SUSP: 1.0000 | Freq: Every day | NASAL | 0 refills | Status: DC

## 2020-12-31 MED ORDER — AMOXICILLIN-POT CLAVULANATE 875-125 MG PO TABS: 1.0000 | ORAL_TABLET | Freq: Two times a day (BID) | ORAL | 0 refills | Status: DC

## 2020-12-31 NOTE — ED Triage Notes (Signed)
Pt reports headache, nasal congestion and sinus pressure x 8 days.

## 2020-12-31 NOTE — Discharge Instructions (Signed)
We are treating you for an infection.  Please take antibiotic twice daily.  Use Flonase daily.  Use Mucinex and Tylenol for additional symptom relief.  Rest and drink plenty of fluid.  If symptoms or not improving please return for reevaluation.

## 2020-12-31 NOTE — ED Provider Notes (Signed)
[place]    CSN: [id number] Arrival date & time: 12/31/20  1059      History   Chief Complaint Chief Complaint  Patient presents with   Headache    HPI Diane King is a 55 y.o. female.   Patient presents today with a 8 to 9-day history of sinus symptoms.  Reports significant nasal congestion, headache, fatigue.  Denies any significant cough, shortness of breath, chest pain, nausea, vomiting.  She has not tried any over-the-counter medication.  Denies known sick contacts.  She denies history of asthma or allergies.  Does not smoke.  She denies any recent antibiotic use.  She reports pain is rated 8 on a 10 pain scale, localized to periorbital region, described as intense pressure, worse with bending forward, no alleviating factors identified.   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 Authorizing Provider  amoxicillin-clavulanate (AUGMENTIN) 875-125 MG  tablet Take 1 tablet by mouth every 12 (twelve) hours. 12/31/20  Yes Jamorris Ndiaye K, PA-C  fluticasone (FLONASE) 50 MCG/ACT nasal spray Place 1 spray into both nostrils daily. 12/31/20  Yes Iana Buzan K, PA-C  triamcinolone cream (KENALOG) 0.1 % Apply 1 application topically 2 (two) times daily. 12/19/20   Mayers, Cari S, PA-C  Vitamin D, Ergocalciferol, (DRISDOL) 1.25 MG (50000 UNIT) CAPS capsule Take 1 capsule (50,000 Units total) by mouth every 7 (seven) days. 12/20/20   Mayers, Loraine Grip, PA-C    Family History Family History  Problem Relation Age of Onset   Breast cancer Sister    Head & neck cancer Mother    Diabetes Neg Hx    Hypertension Neg Hx     Social History Social History   Tobacco Use   Smoking status: Never   Smokeless tobacco: Never  Vaping Use   Vaping Use: Never used  Substance Use Topics   Alcohol use: No   Drug use: Never    Comment: Patient info given.  Pt. denies smoking      Allergies   Patient has no known allergies.   Review of Systems Review of Systems  Constitutional:  Positive for activity change. Negative for appetite change, fatigue  and fever.  HENT:  Positive for congestion, sinus pressure and sinus pain. Negative for sneezing and sore throat.   Respiratory:  Negative for cough and shortness of breath.   Cardiovascular:  Negative for chest pain.  Gastrointestinal:  Negative for abdominal pain, diarrhea, nausea and vomiting.  Musculoskeletal:  Negative for arthralgias and myalgias.  Neurological:  Positive for headaches. Negative for dizziness and light-headedness.    Physical Exam Triage Vital Signs ED Triage Vitals  Enc Vitals Group     BP 12/31/20 1316 (!) 121/54     Pulse Rate 12/31/20 1316 83     Resp 12/31/20 1316 18     Temp 12/31/20 1316 99.4 F (37.4 C)     Temp src --      SpO2 12/31/20 1316 100 %     Weight --      Height --      Head Circumference --      Peak Flow --      Pain Score 12/31/20 1320 8     Pain Loc --       Pain Edu? --      Excl. in [place]? --    No data found.  Updated Vital Signs BP (!) 121/54   Pulse 83   Temp 99.4 F (37.4 C)   Resp 18   SpO2 100%   Visual Acuity Right Eye Distance:   Left Eye Distance:   Bilateral Distance:    Right Eye Near:   Left Eye Near:    Bilateral Near:     Physical Exam Vitals reviewed.  Constitutional:      General: She is awake. She is not in acute distress.    Appearance: Normal appearance. She is well-developed. She is not ill-appearing.     Comments: Very pleasant female appears stated age no acute distress sitting comfortably in exam room  HENT:     Head: Normocephalic and atraumatic.     Right Ear: Tympanic membrane, ear canal and external ear normal. Tympanic membrane is not erythematous or bulging.     Left Ear: Tympanic membrane, ear canal and external ear normal. Tympanic membrane is not erythematous or bulging.     Nose:     Right Sinus: Maxillary sinus tenderness and frontal sinus tenderness present.     Left Sinus: Maxillary sinus tenderness and frontal sinus tenderness present.     Mouth/Throat:     Pharynx: Uvula midline. No oropharyngeal exudate or posterior oropharyngeal erythema.  Cardiovascular:     Rate and Rhythm: Normal rate and regular rhythm.     Heart sounds: Normal heart sounds, S1 normal and S2 normal. No murmur heard. Pulmonary:     Effort: Pulmonary effort is normal.     Breath sounds: Normal breath sounds. No wheezing, rhonchi or rales.     Comments: Clear to auscultation bilaterally Psychiatric:        Behavior: Behavior is cooperative.     UC Treatments / Results  Labs (all labs ordered are listed, but only abnormal results are displayed) Labs Reviewed - No data to display  EKG   Radiology No results found.  Procedures Procedures (including critical care time)  Medications Ordered in UC Medications - No data to display  Initial Impression / Assessment and Plan / UC Course  I have reviewed  the triage vital signs and the nursing notes.  Pertinent labs & imaging results that were available during my care of the patient were reviewed by me and considered  in my medical decision making (see chart for details).     No indication for viral testing given patient has been symptomatic for over a week and this would not change management.  Given prolonged and worsening symptoms concern for sinus infection.  Patient was started on Augmentin twice daily for 10 days.  Recommend she use over-the-counter medications including Mucinex and Tylenol for symptom relief.  She was prescribed Flonase to be used daily for nasal congestion.  Recommend she rest and drink plenty of fluid.  Discussed alarm symptoms that warrant emergent evaluation.  Strict return precautions given to which she expressed understanding.  Final Clinical Impressions(s) / UC Diagnoses   Final diagnoses:  Acute non-recurrent pansinusitis  Sinus headache     Discharge Instructions      We are treating you for an infection.  Please take antibiotic twice daily.  Use Flonase daily.  Use Mucinex and Tylenol for additional symptom relief.  Rest and drink plenty of fluid.  If symptoms or not improving please return for reevaluation.     ED Prescriptions     Medication Sig Dispense Auth. Provider   amoxicillin-clavulanate (AUGMENTIN) 875-125 MG tablet Take 1 tablet by mouth every 12 (twelve) hours. 20 tablet Orin Eberwein K, PA-C   fluticasone (FLONASE) 50 MCG/ACT nasal spray Place 1 spray into both nostrils daily. 16 g Severa Jeremiah K, PA-C      PDMP not reviewed this encounter.   Terrilee Croak, PA-C 12/31/20 1334

## 2021-01-02 ENCOUNTER — Other Ambulatory Visit: Payer: Self-pay

## 2021-01-02 ENCOUNTER — Encounter: Payer: Self-pay | Admitting: Nurse Practitioner

## 2021-01-02 ENCOUNTER — Ambulatory Visit: Payer: Self-pay | Attending: Nurse Practitioner | Admitting: Nurse Practitioner

## 2021-01-02 DIAGNOSIS — R519 Headache, unspecified: Secondary | ICD-10-CM

## 2021-01-02 NOTE — Progress Notes (Signed)
Virtual Visit via Telephone Note Due to national recommendations of social distancing due to [place] 19, telehealth visit is felt to be most appropriate for this patient at this time.  I discussed the limitations, risks, security and privacy concerns of performing an evaluation and management service by telephone and the availability of in person appointments. I also discussed with the patient that there may be a patient responsible charge related to this service. The patient expressed understanding and agreed to proceed.    I connected with Diane King on 01/02/21  at   2:30 PM EST  EDT by telephone and verified that I am speaking with the correct person using two identifiers.  Location of Patient: Private Residence   Location of Provider: [place] [facility] [place] participating in Telemedicine visit: Geryl Rankins FNP-BC Adrine Hayworth  Spanish Interpreter [phone]   History of Present Illness: Telemedicine visit for: Headache  She was evaluated in the ED on 12-31-20 and treated for sinus headache and pansinusitis with augmentin and flonase. Today she has complaints of persistent headache. I have recommended ibuprofen or tylenol for her headache pain. She should continue on abx and nasal spray as prescribed as this is only day 2 of treatment. I have instructed her that if she continues with headaches after completion of current medications we will be glad to evaluate her for this.     Past Medical History:  Diagnosis Date   Cervical cancer ([place]) 07-27-2009 CLINICAL IB1   POORLY DIFFERENTIATED SQUAMOUS CELL CARCINOMA   History of chemotherapy 11/15/09   Cisplatin 4 cycles weekly completed 11/15/09   Hx of radiation therapy 10/10/09-11/14/09   cervical    Hx of radiation therapy 12/14/09-12/17/09   cesium implant intracavity    Past Surgical History:  Procedure Laterality Date   BILATERAL SALPINGOOPHORECTOMY  2011   Bilateral pelvic lymph node  dissection    Family History  Problem Relation Age of Onset   Breast cancer Sister    Head & neck cancer Mother    Diabetes Neg Hx    Hypertension Neg Hx     Social History   Socioeconomic History   Marital status: Single    Spouse name: Not on file   Number of children: 3   Years of education: Not on file   Highest education level: 6th grade  Occupational History    Employer: UNEMPLOYED    Comment: widowed  Tobacco Use   Smoking status: Never   Smokeless tobacco: Never  Vaping Use   Vaping Use: Never used  Substance and Sexual Activity   Alcohol use: No   Drug use: Never    Comment: Patient info given.  Pt. denies smoking    Sexual activity: Yes    Birth control/protection: None  Other Topics Concern   Not on file  Social History Narrative   Not on file   Social Determinants of Health   Financial Resource Strain: Not on file  Food Insecurity: Not on file  Transportation Needs: Not on file  Physical Activity: Not on file  Stress: Not on file  Social Connections: Not on file     Observations/Objective: Awake, alert and oriented x 3   ROS  Assessment and Plan: Diagnoses and all orders for this visit:  Sinus headache  Continue augmentin and flonase as prescribed. May alternate with tylenol and ibuprofen for headaches  Follow Up Instructions Return if symptoms worsen or fail to improve.     I  discussed the assessment and treatment plan with the patient. The patient was provided an opportunity to ask questions and all were answered. The patient agreed with the plan and demonstrated an understanding of the instructions.   The patient was advised to call back or seek an in-person evaluation if the symptoms worsen or if the condition fails to improve as anticipated.  I provided 10 minutes of non-face-to-face time during this encounter including median intraservice time, reviewing previous notes, labs, imaging, medications and explaining diagnosis and  management.  Gildardo Pounds, FNP-BC

## 2021-01-25 ENCOUNTER — Ambulatory Visit: Payer: Self-pay | Admitting: *Deleted

## 2021-01-25 ENCOUNTER — Encounter (INDEPENDENT_AMBULATORY_CARE_PROVIDER_SITE_OTHER): Payer: Self-pay

## 2021-01-25 ENCOUNTER — Ambulatory Visit
Admission: RE | Admit: 2021-01-25 | Discharge: 2021-01-25 | Disposition: A | Discharge status: Home / Self Care | Payer: No Typology Code available for payment source | Source: Ambulatory Visit | Attending: Obstetrics and Gynecology | Admitting: Obstetrics and Gynecology

## 2021-01-25 ENCOUNTER — Other Ambulatory Visit: Payer: Self-pay

## 2021-01-25 VITALS — BP 134/76 | Wt 163.3 lb

## 2021-01-25 DIAGNOSIS — Z1239 Encounter for other screening for malignant neoplasm of breast: Secondary | ICD-10-CM

## 2021-01-25 DIAGNOSIS — Z1231 Encounter for screening mammogram for malignant neoplasm of breast: Secondary | ICD-10-CM

## 2021-01-25 DIAGNOSIS — Z1211 Encounter for screening for malignant neoplasm of colon: Secondary | ICD-10-CM

## 2021-01-25 NOTE — Patient Instructions (Signed)
Explained breast self awareness with [name]. Patient did not need a Pap smear today due to last Pap smear was 06/04/2019. Patient is being followed by GYN Oncology at the [facility] for Pap smears. Referred patient to the [facility] for a screening mammogram on the mobile unit. Appointment scheduled Thursday, January 25, 2021 at 1050. Patient escorted to the mobile unit following BCCCP appointment for her screening mammogram. Let patient know the Breast Center will follow up with her within the next couple weeks with results of her mammogram by letter or phone. Diane King verbalized understanding.  Erika Slaby, Arvil Chaco, RN 9:29 AM

## 2021-01-25 NOTE — Progress Notes (Signed)
Ms. Cynda Soule is a 55 y.o. female who presents to [facility] clinic today with no complaints.    Pap Smear: Pap smear not completed today. Last Pap smear was 06/04/2019 at the [facility] and was normal with negative HPV. Patient has a history of and abnormal Pap smear 12/21/2012 that was ASCUS with positive HPV and cervical cancer. Patient is followed by GYN Oncology at the [facility]. Pap smear results since 2014 are available in EPIC.   Physical exam: Breasts Breasts symmetrical. No skin abnormalities bilateral breasts. No nipple retraction right breast. Left nipple inverted that per patient is normal for her. Documented in previous BCCCP assessment 03/05/2018. No nipple discharge bilateral breasts. No lymphadenopathy. No lumps palpated bilateral breasts. No complaints of pain or tenderness on exam.     MS DIGITAL SCREENING BILATERAL  Result Date: 11/15/2016 CLINICAL DATA:  Screening. EXAM: DIGITAL SCREENING BILATERAL MAMMOGRAM WITH CAD COMPARISON:  Previous exam(s). ACR Breast Density Category c: The breast tissue is heterogeneously dense, which may obscure small masses. FINDINGS: There are no findings suspicious for malignancy. Images were processed with CAD. IMPRESSION: No mammographic evidence of malignancy. A result letter of this screening mammogram will be mailed directly to the patient. RECOMMENDATION: Screening mammogram in one year. (Code:SM-B-01Y) BI-RADS CATEGORY  1: Negative. Electronically Signed   By: Nolon Nations M.D.   On: 11/15/2016 16:31   MS DIGITAL SCREENING TOMO BILATERAL  Result Date: 04/20/2019 CLINICAL DATA:  Screening. EXAM: DIGITAL SCREENING BILATERAL MAMMOGRAM WITH TOMO AND CAD COMPARISON:  Previous exam(s). ACR Breast Density Category b: There are scattered areas of fibroglandular density. FINDINGS: There are no findings suspicious for malignancy. Images were processed with CAD. IMPRESSION: No mammographic evidence of  malignancy. A result letter of this screening mammogram will be mailed directly to the patient. RECOMMENDATION: Screening mammogram in one year. (Code:SM-B-01Y) BI-RADS CATEGORY  1: Negative. Electronically Signed   By: Kristopher Oppenheim M.D.   On: 04/20/2019 15:55   MS DIGITAL SCREENING TOMO BILATERAL  Result Date: 03/05/2018 CLINICAL DATA:  Screening. EXAM: DIGITAL SCREENING BILATERAL MAMMOGRAM WITH TOMO AND CAD COMPARISON:  Previous exam(s). ACR Breast Density Category b: There are scattered areas of fibroglandular density. FINDINGS: There are no findings suspicious for malignancy. Images were processed with CAD. IMPRESSION: No mammographic evidence of malignancy. A result letter of this screening mammogram will be mailed directly to the patient. RECOMMENDATION: Screening mammogram in one year. (Code:SM-B-01Y) BI-RADS CATEGORY  1: Negative. Electronically Signed   By: Franki Cabot M.D.   On: 03/05/2018 16:50    Pelvic/Bimanual Pap is not indicated today per BCCCP guidelines.   Smoking History: Patient has never smoked.   Patient Navigation: Patient education provided. Access to services provided for patient through [place] [facility] [place] program. Spanish interpreter Rudene Anda from [facility] provided.   Colorectal Cancer Screening: Per patient has never had colonoscopy completed. FIT Test given to patient to complete. No complaints today.    Breast and Cervical Cancer Risk Assessment: Patient has a family history of her sister having breast cancer. Patient has no known genetic mutations or history of radiation treatment to the chest before age 17. Patient has a history of cervical cancer. Patient has no history of being immunocompromised or DES exposure in-utero.   Risk Assessment     Risk Scores       01/25/2021 04/20/2019   Last edited by: Demetrius Revel, LPN McGill, Sherie Mamie Nick, LPN   5-year risk: 1.6 % 1.5 %  Lifetime risk: 10 % 10.5 %            A: BCCCP exam without pap smear No  complaints.  P: Referred patient to the [facility] for a screening mammogram on the mobile unit. Appointment scheduled Thursday, January 25, 2021 at 1050.  Loletta Parish, RN 01/25/2021 9:28 AM

## 2021-03-05 ENCOUNTER — Other Ambulatory Visit: Payer: Self-pay

## 2021-03-05 DIAGNOSIS — N6320 Unspecified lump in the left breast, unspecified quadrant: Secondary | ICD-10-CM

## 2021-03-06 ENCOUNTER — Ambulatory Visit (HOSPITAL_COMMUNITY)
Admission: EM | Admit: 2021-03-06 | Discharge: 2021-03-06 | Disposition: A | Discharge status: Home / Self Care | Payer: Self-pay | Attending: Student | Admitting: Student

## 2021-03-06 ENCOUNTER — Other Ambulatory Visit: Payer: Self-pay

## 2021-03-06 ENCOUNTER — Encounter (HOSPITAL_COMMUNITY): Payer: Self-pay

## 2021-03-06 DIAGNOSIS — L03313 Cellulitis of chest wall: Secondary | ICD-10-CM

## 2021-03-06 DIAGNOSIS — Z789 Other specified health status: Secondary | ICD-10-CM

## 2021-03-06 MED ORDER — DOXYCYCLINE HYCLATE 100 MG PO CAPS: 100.0000 mg | ORAL_CAPSULE | Freq: Two times a day (BID) | ORAL | 0 refills | Status: DC

## 2021-03-06 NOTE — Discharge Instructions (Addendum)
-  Doxycycline twice daily for 7 days.  Make sure to wear sunscreen while spending time outside while on this medication as it can increase your chance of sunburn. You can take this medication with food if you have a sensitive stomach. -Wash with gentle soap and water only -You can still use the triple antibiotic ointment -If the area gets bigger and more swollen you can follow-up with the [facility] Center - Dr. Autumn Patty.

## 2021-03-06 NOTE — ED Provider Notes (Signed)
Diane King    CSN: [id number] Arrival date & time: 03/06/21  4332      History   Chief Complaint Chief Complaint  Patient presents with   Lump    HPI Diane King is a 56 y.o. female presenting with lump on R chest wall, concerned this is an infection. Medical history noncontributory. States she'King had the lump for few months (unable to specify), normal mammogram 01/25/21 - Dr. Autumn Patty. Over the last few weeks the area has gotten red and and swollen. Sometimes it itches and hurts. No drainage. No fevers/chills. Has tried an over-the-counter antibiotic ointment without improvement.   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 Authorizing Provider  doxycycline (VIBRAMYCIN) 100 MG capsule Take 1 capsule (100 mg total) by mouth 2 (two) times daily for 7 days. 03/06/21 03/13/21 Yes Diane Sams, PA-C  fluticasone (FLONASE) 50 MCG/ACT nasal spray Place 1 spray  into both nostrils daily. 12/31/20   Diane King, Diane Skill, PA-C  triamcinolone cream (KENALOG) 0.1 % Apply 1 application topically 2 (two) times daily. 12/19/20   Diane King, Diane S, PA-C  Vitamin D, Ergocalciferol, (DRISDOL) 1.25 MG (50000 UNIT) CAPS capsule Take 1 capsule (50,000 Units total) by mouth every 7 (seven) days. 12/20/20   Diane King, Diane Grip, PA-C    Family History Family History  Problem Relation Age of Onset   Breast cancer Sister    Head & neck cancer Mother    Diabetes Neg Hx    Hypertension Neg Hx     Social History Social History   Tobacco Use   Smoking status: Never   Smokeless tobacco: Never  Vaping Use   Vaping Use: Never used  Substance Use Topics   Alcohol use: No   Drug use: Never    Comment: Patient info given.  Pt. denies smoking      Allergies   Patient has no known allergies.   Review of Systems Review of Systems  Skin:  Positive for color change.  All other systems reviewed and are negative.   Physical Exam Triage Vital Signs ED Triage Vitals  Enc Vitals Group     BP 03/06/21  0844 121/60     Pulse Rate 03/06/21 0842 73     Resp 03/06/21 0842 17     Temp 03/06/21 0842 98.9 F (37.2 C)     Temp Source 03/06/21 0842 Oral     SpO2 03/06/21 0842 98 %     Weight --      Height --      Head Circumference --      Peak Flow --      Pain Score 03/06/21 0841 2     Pain Loc --      Pain Edu? --      Excl. in [place]? --    No data found.  Updated Vital Signs BP 121/60    Pulse 73    Temp 98.9 F (37.2 C) (Oral)    Resp 17    SpO2 98%   Visual Acuity Right Eye Distance:   Left Eye Distance:   Bilateral Distance:    Right Eye Near:   Left Eye Near:    Bilateral Near:     Physical Exam Vitals reviewed.  Constitutional:      General: She is not in acute distress.    Appearance: Normal appearance. She is not ill-appearing.  HENT:     Head: Normocephalic and atraumatic.  Pulmonary:     Effort: Pulmonary effort is normal.  Chest:        Comments: 1cm area of erythema, tenderness, induration. No fluctuance. No spontaneous drainage. Neurological:     General: No focal deficit present.     Mental Status: She is alert and oriented to person, place, and time.  Psychiatric:        Mood and Affect: Mood normal.        Behavior: Behavior normal.        Thought Content: Thought content normal.        Judgment: Judgment normal.     UC Treatments / Results  Labs (all labs ordered are listed, but only abnormal results are displayed) Labs Reviewed - No data to display  EKG   Radiology No results found.  Procedures Procedures (including critical care time)  Medications Ordered in UC Medications - No data to display  Initial Impression / Assessment and Plan / UC Course  I have reviewed the triage vital signs and the nursing notes.  Pertinent labs & imaging results that were available during my care of the patient were reviewed by me and considered in my medical decision making (see chart for details).     This patient is a very pleasant 56 y.o. year old female presenting with small area of cellulitis R chest wall. Afebrile, nontachy.   Infection is of the chest wall and not the breast tissue. She is already followed by the breast center- Dr. Autumn Patty. Normal mammo 01/25/21.  Doxycycline as below. Continue OTC antibiotic ointment prn.  ED return precautions discussed. Patient verbalizes understanding and agreement.   Spoke with this patient using language line Diane King [phone]  Final Clinical Impressions(King) / UC Diagnoses   Final diagnoses:  Language barrier  Cellulitis of chest wall     Discharge Instructions      -Doxycycline twice daily for 7 days.  Make sure to wear sunscreen while spending time outside while on this medication as it can increase your chance of sunburn. You can take this medication with food if you have a sensitive stomach. -Wash with gentle soap and water only -You can still use the  triple antibiotic  ointment -If the area gets bigger and more swollen you can follow-up with the Breast Center - Dr. Autumn Patty.     ED Prescriptions     Medication Sig Dispense Auth. Provider   doxycycline (VIBRAMYCIN) 100 MG capsule Take 1 capsule (100 mg total) by mouth 2 (two) times daily for 7 days. 14 capsule Diane Sams, PA-C      PDMP not reviewed this encounter.   Diane Sams, PA-C 03/06/21 [phone]

## 2021-03-06 NOTE — ED Triage Notes (Signed)
Pt presents with c/o a lump on the R side of her chest.   Pt states she was told it could be an infection. States sometimes the lump itches or hurts.

## 2021-03-13 ENCOUNTER — Ambulatory Visit (HOSPITAL_COMMUNITY)
Admission: EM | Admit: 2021-03-13 | Discharge: 2021-03-13 | Disposition: A | Discharge status: Home / Self Care | Payer: Self-pay | Attending: Urgent Care | Admitting: Urgent Care

## 2021-03-13 ENCOUNTER — Other Ambulatory Visit: Payer: Self-pay

## 2021-03-13 ENCOUNTER — Encounter (HOSPITAL_COMMUNITY): Payer: Self-pay | Admitting: Emergency Medicine

## 2021-03-13 DIAGNOSIS — L089 Local infection of the skin and subcutaneous tissue, unspecified: Secondary | ICD-10-CM

## 2021-03-13 DIAGNOSIS — S20121D Blister (nonthermal) of breast, right breast, subsequent encounter: Secondary | ICD-10-CM

## 2021-03-13 MED ORDER — CEPHALEXIN 500 MG PO CAPS: 500.0000 mg | ORAL_CAPSULE | Freq: Four times a day (QID) | ORAL | 0 refills | Status: AC

## 2021-03-13 MED ORDER — MUPIROCIN CALCIUM 2 % NA OINT: TOPICAL_OINTMENT | NASAL | 0 refills | Status: DC

## 2021-03-13 NOTE — ED Triage Notes (Signed)
Has bump on right chest and was seen here 8 days ago, given antibiotics which finished and reports has gotten little smaller but still there. Denies drainage. Reports hurts little bit.

## 2021-03-13 NOTE — Discharge Instructions (Signed)
Empezar un otro antibiotico, se llama cephelexin. Tragarlo cuatro veces cada dia hasta lo terminar. Poner una toalla con agua caliente sobre la piel. Ponerlo por 10 minutos, tres veces cada dia. Despues de la toalla, poner el creama. Llama su doctora si no hay cambio en su sintomas.

## 2021-03-13 NOTE — ED Provider Notes (Signed)
[place]    CSN: [id number] Arrival date & time: 03/13/21  1239      History   Chief Complaint Chief Complaint  Patient presents with   Breast Mass    HPI Diane King is a 55 y.o. female.   56 year old female presents today with concerns for a skin infection on her right breast.  She was seen 1 week ago and placed on doxycycline.  She completed oral antibiotics and stated it started improving, but upon termination of the antibiotics, it started returning.  She has been placing a roasted onion on her skin in hopes that it would draw out the infection, but reports no change to her symptoms.  She did have her most recent mammogram performed in December which was normal. She reports only one lesion, no additional locations are affected.     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 Authorizing Provider  cephALEXin (KEFLEX) 500 MG capsule Take 1 capsule (500 mg  total) by mouth 4 (four) times daily for 10 days. 03/13/21 03/23/21 Yes Tulip Meharg L, PA  mupirocin nasal ointment (BACTROBAN) 2 % Apply one application three times daily after each soak 03/13/21  Yes Elie Leppo L, PA  fluticasone (FLONASE) 50 MCG/ACT nasal spray Place 1 spray into both nostrils daily. 12/31/20   Raspet, Derry Skill, PA-C  triamcinolone cream (KENALOG) 0.1 % Apply 1 application topically 2 (two) times daily. 12/19/20   Mayers, Cari S, PA-C  Vitamin D, Ergocalciferol, (DRISDOL) 1.25 MG (50000 UNIT) CAPS capsule Take 1 capsule (50,000 Units total) by mouth every 7 (seven) days. 12/20/20   Mayers, Loraine Grip, PA-C    Family History Family History  Problem Relation Age of Onset   Breast cancer Sister    Head & neck cancer Mother    Diabetes Neg Hx    Hypertension Neg Hx     Social History Social History   Tobacco Use   Smoking status: Never   Smokeless tobacco: Never  Vaping Use   Vaping Use: Never used  Substance Use Topics   Alcohol use: No   Drug use: Never    Comment: Patient info given.  Pt. denies smoking      Allergies   Patient has no known allergies.   Review of Systems Review of Systems  Skin:  Positive for color change (erythema and swelling to a single lesion on R upper breast near sternum).  All other systems reviewed and are negative.   Physical Exam Triage Vital Signs ED Triage Vitals  Enc Vitals Group     BP 03/13/21 1447 109/73     Pulse Rate 03/13/21 1447 68     Resp 03/13/21 1447 18     Temp 03/13/21 1447 99.6 F (37.6 C)     Temp Source 03/13/21 1447 Oral     SpO2 03/13/21 1447 97 %     Weight --      Height --      Head Circumference --      Peak Flow --      Pain Score 03/13/21 1446 2     Pain Loc --      Pain Edu? --      Excl. in [place]? --    No data found.  Updated Vital Signs BP 109/73 (BP Location: Left Arm)    Pulse 68    Temp 99.6 F (37.6 C) (Oral)    Resp 18    SpO2 97%   Visual Acuity Right Eye Distance:   Left  Eye Distance:   Bilateral Distance:    Right Eye Near:   Left Eye Near:    Bilateral Near:     Physical Exam Vitals and nursing note reviewed.  Constitutional:      General: She is not in acute distress.    Appearance: Normal appearance. She is obese. She is not ill-appearing, toxic-appearing or diaphoretic.  HENT:     Head: Normocephalic.  Skin:    General: Skin is warm.     Capillary Refill: Capillary refill takes less than 2 seconds.     Coloration: Skin is not cyanotic.     Findings: Lesion (single erythematous lesion to R upper inner quad of breast near sternum, appears to be superficial and local to skin only. No drainage, dime sized) present.     Nails: There is no clubbing.     Comments: NO mass or tenderness to breast itself, no lymphadenopathy  Neurological:     Mental Status: She is alert.     UC Treatments / Results  Labs (all labs ordered are listed, but only abnormal results are displayed) Labs Reviewed - No data to display  EKG   Radiology No results found.  Procedures Procedures (including critical care time)  Medications Ordered in UC Medications - No data to display  Initial Impression / Assessment and Plan / UC Course  I have reviewed the triage vital signs and the nursing notes.  Pertinent labs & imaging results that were available during my care of the patient were reviewed by me and considered in my medical decision making (see chart for details).     Infected lesion of right breast -patient already completed the doxycycline.  We will switch antibiotics to cephalexin and topical mupirocin.  Stop using the onion, I question if some of the erythema is actually a local skin reaction.  Warm moist compresses may also help.  Recent mammogram normal.  Follow-up with PCP should symptoms persist  Final Clinical Impressions(s) / UC Diagnoses   Final diagnoses:  Infected blister of right breast, subsequent encounter     Discharge Instructions       Empezar  un otro antibiotico, se llama cephelexin. Tragarlo cuatro veces cada dia hasta lo terminar. Poner una toalla con agua caliente sobre la piel. Ponerlo por 10 minutos, tres veces cada dia. Despues de la toalla, poner el creama. Llama su doctora si no hay cambio en su sintomas.    ED Prescriptions     Medication Sig Dispense Auth. Provider   cephALEXin (KEFLEX) 500 MG capsule Take 1 capsule (500 mg total) by mouth 4 (four) times daily for 10 days. 40 capsule Yazhini Mcaulay L, PA   mupirocin nasal ointment (BACTROBAN) 2 % Apply one application three times daily after each soak 22 g Candra Wegner L, PA      PDMP not reviewed this encounter.   Chaney Malling, [place] 03/13/21 2143

## 2021-03-22 ENCOUNTER — Ambulatory Visit: Payer: No Typology Code available for payment source | Admitting: *Deleted

## 2021-03-22 ENCOUNTER — Other Ambulatory Visit: Payer: Self-pay

## 2021-03-22 VITALS — BP 128/70 | Wt 163.9 lb

## 2021-03-22 DIAGNOSIS — Z1239 Encounter for other screening for malignant neoplasm of breast: Secondary | ICD-10-CM

## 2021-03-22 DIAGNOSIS — N6312 Unspecified lump in the right breast, upper inner quadrant: Secondary | ICD-10-CM

## 2021-03-22 NOTE — Patient Instructions (Addendum)
Explained breast self awareness with Diane King. Patient did not need a Pap smear today due to last Pap smear and HPV typing was 06/04/2019. Patient is being followed by GYN Oncology at the [facility] for Pap smears. Referred patient to the [facility] for a right breast diagnostic mammogram. Appointment scheduled Thursday, March 29, 2021 at [place]. Patient aware of appointment and will be there. Diane King verbalized understanding.  Diane King, Arvil Chaco, RN 9:17 AM

## 2021-03-22 NOTE — Progress Notes (Signed)
[name] is a 56 y.o. female who presents to [facility] clinic today with complaint of right upper breast lump, redness, and pain that started 22 days ago. Patient went to the ED on 03/06/2021 was prescribed Keflex. Patient stated the right breast lump, redness, and pain has decreased since on the antibiotic. Patent stated the pain comes and goes. Patient rates the pain today at a 2 out of 10. Patient stated the right breast lump is now a purplish color.     Pap Smear: Pap smear not completed today. Last Pap smear was 06/04/2019 at the [facility] and was normal with negative HPV. Patient has a history of and abnormal Pap smear 12/21/2012 that was ASCUS with positive HPV and cervical cancer. Patient is followed by GYN Oncology at the [facility]. Pap smear results since 2014 are available in EPIC.   Physical exam: Breasts Breasts symmetrical. No skin abnormalities left breast. Observed a purplish area on skin within the right upper inner breast that per patient was red at first. No nipple retraction right breast. Left nipple inverted that per patient is normal for her. Documented in previous BCCCP assessment 03/05/2018. No nipple discharge bilateral breasts. No lymphadenopathy. No lumps palpated left breast. Palpated a lump within the right breast at 1 o'clock 11 cm from the nipple that measures 2 cm x 2 cm that is purplish in color. Complaints of tenderness when palpated right breast lump.  MS DIGITAL SCREENING BILATERAL  Result Date: 11/15/2016 CLINICAL DATA:  Screening. EXAM: DIGITAL SCREENING BILATERAL MAMMOGRAM WITH CAD COMPARISON:  Previous exam(s). ACR Breast Density Category c: The breast tissue is heterogeneously dense, which may obscure small masses. FINDINGS: There are no findings suspicious for malignancy. Images were processed with CAD. IMPRESSION: No mammographic evidence of malignancy. A result letter of this screening mammogram will be  mailed directly to the patient. RECOMMENDATION: Screening mammogram in one year. (Code:SM-B-01Y) BI-RADS CATEGORY  1: Negative. Electronically Signed   By: Nolon Nations M.D.   On: 11/15/2016 16:31   MM 3D SCREEN BREAST BILATERAL  Result Date: 01/26/2021 CLINICAL DATA:  Screening. EXAM: DIGITAL SCREENING BILATERAL MAMMOGRAM WITH TOMOSYNTHESIS AND CAD TECHNIQUE: Bilateral screening digital craniocaudal and mediolateral oblique mammograms were obtained. Bilateral screening digital breast tomosynthesis was performed. The images were evaluated with computer-aided detection. COMPARISON:  Previous exam(s). ACR Breast Density Category b: There are scattered areas of fibroglandular density. FINDINGS: There are no findings suspicious for malignancy. IMPRESSION: No mammographic evidence of malignancy. A result letter of this screening mammogram will be mailed directly to the patient. RECOMMENDATION: Screening mammogram in one year. (Code:SM-B-01Y) BI-RADS CATEGORY  1: Negative. Electronically Signed   By: Lajean Manes M.D.   On: 01/26/2021 13:39   MS DIGITAL SCREENING TOMO BILATERAL  Result Date: 04/20/2019 CLINICAL DATA:  Screening. EXAM: DIGITAL SCREENING BILATERAL MAMMOGRAM WITH TOMO AND CAD COMPARISON:  Previous exam(s). ACR Breast Density Category b: There are scattered areas of fibroglandular density. FINDINGS: There are no findings suspicious for malignancy. Images were processed with CAD. IMPRESSION: No mammographic evidence of malignancy. A result letter of this screening mammogram will be mailed directly to the patient. RECOMMENDATION: Screening mammogram in one year. (Code:SM-B-01Y) BI-RADS CATEGORY  1: Negative. Electronically Signed   By: Kristopher Oppenheim M.D.   On: 04/20/2019 15:55   MS DIGITAL SCREENING TOMO BILATERAL  Result Date: 03/05/2018 CLINICAL DATA:  Screening. EXAM: DIGITAL SCREENING BILATERAL MAMMOGRAM WITH TOMO AND CAD COMPARISON:  Previous exam(s).  ACR Breast Density Category b: There  are scattered areas of fibroglandular density. FINDINGS: There are no findings suspicious for malignancy. Images were processed with CAD. IMPRESSION: No mammographic evidence of malignancy. A result letter of this screening mammogram will be mailed directly to the patient. RECOMMENDATION: Screening mammogram in one year. (Code:SM-B-01Y) BI-RADS CATEGORY  1: Negative. Electronically Signed   By: Franki Cabot M.D.   On: 03/05/2018 16:50     Pelvic/Bimanual Pap is not indicated today per BCCCP guidelines.   Smoking History: Patient has never smoked.   Patient Navigation: Patient education provided. Access to services provided for patient through [place] program. Spanish interpreter Rudene Anda from [facility] provided.    Colorectal Cancer Screening: Per patient has never had colonoscopy completed. FIT Test given to patient to complete on previous visit 01/25/2021 that she has not completed. Explained the importance of colorectal cancer screening and completing FIT Test. No complaints today.    Breast and Cervical Cancer Risk Assessment: Patient has a family history of her sister having breast cancer. Patient has no known genetic mutations or history of radiation treatment to the chest before age 15. Patient has a history of cervical cancer. Patient has no history of being immunocompromised or DES exposure in-utero.   Risk Assessment     Risk Scores       03/22/2021 01/25/2021   Last edited by: Demetrius Revel, LPN [place], Sherie Mamie Nick, LPN   5-year risk: 1.6 % 1.6 %   Lifetime risk: 10 % 10 %            A: BCCCP exam without pap smear Complaint of right breast lump, redness, and pain.  P: Referred patient to the [facility] for a right breast diagnostic mammogram. Appointment scheduled Thursday, March 29, 2021 at [place].  Loletta Parish, RN 03/22/2021 9:17 AM

## 2021-03-29 ENCOUNTER — Other Ambulatory Visit: Payer: Self-pay | Admitting: Obstetrics and Gynecology

## 2021-03-29 ENCOUNTER — Ambulatory Visit
Admission: RE | Admit: 2021-03-29 | Discharge: 2021-03-29 | Disposition: A | Discharge status: Home / Self Care | Payer: No Typology Code available for payment source | Source: Ambulatory Visit | Attending: Obstetrics and Gynecology | Admitting: Obstetrics and Gynecology

## 2021-03-29 ENCOUNTER — Other Ambulatory Visit: Payer: Self-pay

## 2021-03-29 DIAGNOSIS — N6310 Unspecified lump in the right breast, unspecified quadrant: Secondary | ICD-10-CM

## 2021-03-29 DIAGNOSIS — N6320 Unspecified lump in the left breast, unspecified quadrant: Secondary | ICD-10-CM

## 2021-04-13 ENCOUNTER — Ambulatory Visit: Payer: Self-pay | Attending: Nurse Practitioner

## 2021-04-13 ENCOUNTER — Other Ambulatory Visit: Payer: Self-pay

## 2021-05-01 ENCOUNTER — Telehealth: Payer: Self-pay | Admitting: Nurse Practitioner

## 2021-05-01 NOTE — Telephone Encounter (Signed)
Pt was sent a letter from financial dept. Inform them, that the application they submitted was incomplete, since they were missing some documentation at the time of the appointment, Pt need to reschedule and resubmit all new papers and application for CAFA and OC, P.S. old documents has been sent back by mail to the Pt and Pt. need to make a new appt. 

## 2021-09-20 ENCOUNTER — Encounter (HOSPITAL_COMMUNITY): Payer: Self-pay | Admitting: *Deleted

## 2021-09-20 ENCOUNTER — Ambulatory Visit (HOSPITAL_COMMUNITY)
Admission: EM | Admit: 2021-09-20 | Discharge: 2021-09-20 | Disposition: A | Discharge status: Home / Self Care | Payer: No Typology Code available for payment source | Attending: Internal Medicine | Admitting: Internal Medicine

## 2021-09-20 DIAGNOSIS — L02415 Cutaneous abscess of right lower limb: Secondary | ICD-10-CM

## 2021-09-20 MED ORDER — SULFAMETHOXAZOLE-TRIMETHOPRIM 800-160 MG PO TABS: 1.0000 | ORAL_TABLET | Freq: Two times a day (BID) | ORAL | 0 refills | Status: AC

## 2021-09-20 MED ORDER — LIDOCAINE-EPINEPHRINE 1 %-1:100000 IJ SOLN
INTRAMUSCULAR | Status: AC
  Filled 2021-09-20: qty 1

## 2021-09-20 MED ORDER — IBUPROFEN 600 MG PO TABS
600.0000 mg | ORAL_TABLET | Freq: Four times a day (QID) | ORAL | 0 refills | Status: DC | PRN
Start: 2021-09-20 — End: 2022-05-07

## 2021-09-20 NOTE — ED Triage Notes (Signed)
Via [employer] video interpreter [phone]:  Reports 2 lesions to right lateral lower leg onset 5 days ago; states started with intense pruritis in that area. Reports applying warm water compresses to area, and had some bloody drainage from one lesion. One circular lesion with surrounding redness and swelling; other lesion is circular dried scab.  States has had some general malaise and has felt feverish; taking IBU.

## 2021-09-20 NOTE — ED Provider Notes (Addendum)
[place]    CSN: [id number] Arrival date & time: 09/20/21  8938      History   Chief Complaint Chief Complaint  Patient presents with   Leg lesions    HPI Diane King is a 56 y.o. female comes to the urgent care with 5-day history of painful itchy swelling on the right leg.  Symptoms started 5 days ago and has gotten worse.  Patient denies any insect bite or trauma to the place.  Over the past 5 days the area has become progressively painful, swollen and tender to touch.  Patient endorses some discharge from the area involved.  She denies any fever, chills, nausea or vomiting. 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 Authorizing Provider  ibuprofen (ADVIL) 600 MG tablet Take 1 tablet (600 mg total) by mouth every 6 (six) hours as needed. 09/20/21  Yes Ambyr Qadri, Myrene Galas, MD  sulfamethoxazole-trimethoprim (BACTRIM DS) 800-160 MG tablet Take 1 tablet by  mouth 2 (two) times daily for 7 days. 09/20/21 09/27/21 Yes Sheyanne Munley, Myrene Galas, MD    Family History Family History  Problem Relation Age of Onset   Breast cancer Sister    Head & neck cancer Mother    Diabetes Neg Hx    Hypertension Neg Hx     Social History Social History   Tobacco Use   Smoking status: Never   Smokeless tobacco: Never  Vaping Use   Vaping Use: Never used  Substance Use Topics   Alcohol use: No   Drug use: Never     Allergies   Patient has no known allergies.   Review of Systems Review of Systems  Constitutional: Negative.   Skin:  Positive for color change and wound.  Neurological: Negative.      Physical Exam Triage Vital Signs ED Triage Vitals  Enc Vitals Group     BP 09/20/21 0819 118/78     Pulse Rate 09/20/21 0819 87     Resp 09/20/21 0819 14     Temp 09/20/21 0819 98 F (36.7 C)     Temp Source 09/20/21 0819 Oral     SpO2 09/20/21 0819  97 %     Weight --      Height --      Head Circumference --      Peak Flow --      Pain Score 09/20/21 0821 8     Pain Loc --      Pain Edu? --      Excl. in [employer] [facility]? --    No data found.  Updated Vital Signs BP 118/78   Pulse 87   Temp 98 F (36.7 C) (Oral)   Resp 14   SpO2 97%   Visual Acuity Right Eye Distance:   Left Eye Distance:   Bilateral Distance:    Right Eye Near:   Left Eye Near:    Bilateral Near:     Physical Exam Vitals and nursing note reviewed.  Constitutional:      General: She is not in acute distress.    Appearance: She is not ill-appearing.  Cardiovascular:     Rate and Rhythm: Normal rate and regular rhythm.  Skin:    Comments: Tender swollen on the right leg measuring about 2 x 3 inches.  Mild overlying erythema.  Purulent discharge noted.  Slightly warm to touch.  No significant fluctuance.  Neurological:     Mental Status: She is alert.      UC Treatments / Results  Labs (all labs ordered are listed, but only abnormal results are displayed) Labs  Reviewed - No data to display  EKG   Radiology No results found.  Procedures Incision and Drainage  Date/Time: 09/20/2021 9:25 AM  Performed by: Chase Picket, MD Authorized by: Chase Picket, MD   Consent:    Consent obtained:  Verbal   Consent given by:  Patient   Risks discussed:  Incomplete drainage and bleeding   Alternatives discussed:  Delayed treatment Universal protocol:    Patient identity confirmed:  Verbally with patient Location:    Type:  Abscess   Location:  Lower extremity   Lower extremity location:  Leg   Leg location:  R upper leg Pre-procedure details:    Skin preparation:  Povidone-iodine Anesthesia:    Anesthesia method:  Local infiltration   Local anesthetic:  Lidocaine 2% WITH epi Procedure type:    Complexity:  Simple Procedure details:    Ultrasound guidance: no     Needle aspiration: no     Incision types:  Cruciate   Incision depth:  Subcutaneous   Wound management:  Probed and deloculated   Drainage:  Purulent and bloody   Drainage amount:  Moderate   Packing materials:  None Post-procedure details:    Procedure completion:  Tolerated well, no immediate complications  (including critical care time)  Medications Ordered in UC Medications - No data to display  Initial Impression / Assessment and Plan / UC Course  I have reviewed the triage vital signs and the nursing notes.  Pertinent labs & imaging results that were available during my care of the patient were reviewed by me and considered in my medical decision making (see chart for details).        1.  Abscess of the right leg: This is likely staph aureus infection Incision and drainage completed Bactrim double strength 1 twice daily for 7 days Ibuprofen as needed for pain and/or fever Wound dressing changes recommendations discussed Return precautions given. Final Clinical Impressions(s) / UC Diagnoses   Final diagnoses:  Abscess of right leg     Discharge  Instructions  Daily wound dressing changes with topical antibiotic ointment Okay to wash the wound with mild soap and water Take medications as prescribed Ibuprofen as needed for pain and/or fever Return to urgent care if you experience worsening pain, swelling, discharge, fever, chills or worsening redness around the area     ED Prescriptions     Medication Sig Dispense Auth. Provider   sulfamethoxazole-trimethoprim (BACTRIM DS) 800-160 MG tablet Take 1 tablet by mouth 2 (two) times daily for 7 days. 14 tablet Elmon Shader, Myrene Galas, MD   ibuprofen (ADVIL) 600 MG tablet Take 1 tablet (600 mg total) by mouth every 6 (six) hours as needed. 30 tablet Jayani Rozman, Myrene Galas, MD      PDMP not reviewed this encounter.   Chase Picket, MD 09/20/21 [id number]    Chase Picket, MD 09/20/21 8453    Chase Picket, MD 10/04/21 [phone]

## 2021-09-20 NOTE — Discharge Instructions (Signed)
Daily wound dressing changes with topical antibiotic ointment Okay to wash the wound with mild soap and water Take medications as prescribed Ibuprofen as needed for pain and/or fever Return to urgent care if you experience worsening pain, swelling, discharge, fever, chills or worsening redness around the area

## 2021-12-03 ENCOUNTER — Other Ambulatory Visit: Payer: Self-pay

## 2021-12-03 DIAGNOSIS — Z1231 Encounter for screening mammogram for malignant neoplasm of breast: Secondary | ICD-10-CM

## 2022-01-31 ENCOUNTER — Ambulatory Visit: Payer: Self-pay

## 2022-03-07 ENCOUNTER — Ambulatory Visit
Admission: RE | Admit: 2022-03-07 | Discharge: 2022-03-07 | Disposition: A | Discharge status: Home / Self Care | Payer: No Typology Code available for payment source | Source: Ambulatory Visit | Attending: Obstetrics and Gynecology | Admitting: Obstetrics and Gynecology

## 2022-03-07 ENCOUNTER — Ambulatory Visit: Payer: Self-pay | Admitting: Hematology and Oncology

## 2022-03-07 VITALS — BP 116/72 | Wt 175.0 lb

## 2022-03-07 DIAGNOSIS — Z1211 Encounter for screening for malignant neoplasm of colon: Secondary | ICD-10-CM

## 2022-03-07 DIAGNOSIS — Z1231 Encounter for screening mammogram for malignant neoplasm of breast: Secondary | ICD-10-CM

## 2022-03-07 NOTE — Patient Instructions (Signed)
[place] about breast self awareness and gave educational materials to take home. Patient did not need a Pap smear today due to last Pap smear was in 2021 per patient. Let her know BCCCP will cover Pap smears every 5 years unless has a history of abnormal Pap smears. Referred patient to the [facility] for diagnostic mammogram. Appointment scheduled for 03/07/22. Patient aware of appointment and will be there. Let patient know will follow up with her within the next couple weeks with results. Diane King verbalized understanding.  Melodye Ped, NP 1:12 PM

## 2022-03-07 NOTE — Progress Notes (Signed)
[name] is a 57 y.o. female who presents to [facility] clinic today with no complaints .    Pap Smear: Pap not smear completed today. Last Pap smear was 2021 and was normal. Per patient has history of an abnormal Pap smear. Last Pap smear result is not available in Epic. ASCUS/ HPV+ in 2015, ASCUS/ HPV+ 2014   Physical exam: Breasts Breasts symmetrical. No skin abnormalities bilateral breasts. No nipple retraction bilateral breasts. No nipple discharge bilateral breasts. No lymphadenopathy. No lumps palpated bilateral breasts.     MS DIGITAL DIAG TOMO UNI RIGHT  Result Date: 03/29/2021 CLINICAL DATA:  57 year old female with a palpable abnormality involving the skin of her superior right breast which has been present for several months. The patient did receive a course of antibiotics in this is decreasing in size. EXAM: DIGITAL DIAGNOSTIC UNILATERAL RIGHT MAMMOGRAM WITH TOMOSYNTHESIS AND CAD; ULTRASOUND RIGHT BREAST LIMITED TECHNIQUE: Right digital diagnostic mammography and breast tomosynthesis was performed. The images were evaluated with computer-aided detection.; Targeted ultrasound examination of the right breast was performed COMPARISON:  Previous exams. ACR Breast Density Category c: The breast tissue is heterogeneously dense, which may obscure small masses. FINDINGS: Spot compression tomograms were performed over the palpable area of concern in the upper right breast demonstrating an ill-defined somewhat oval asymmetry. Physical examination reveals focal thickening of the skin at the approximate 1 to 2 o'clock position with overlying brownish skin discoloration and a central black pore. Targeted ultrasound of the right breast was performed demonstrating a small near anechoic mass in the skin of the right breast at 2 o'clock 10 cm from nipple measuring 1.5 x 0.3 x 1.1 cm. Findings are most consistent with a sebaceous cyst. IMPRESSION: Sebaceous cyst at site of palpable concern in the right  breast. RECOMMENDATION: 1.  Clinical follow-up for right breast sebaceous cyst. 2.  Recommend annual screening mammography, due December 2023. I have discussed the findings and recommendations with the patient. If applicable, a reminder letter will be sent to the patient regarding the next appointment. BI-RADS CATEGORY  2: Benign. Electronically Signed   By: Everlean Alstrom M.D.   On: 03/29/2021 10:43  MM 3D SCREEN BREAST BILATERAL  Result Date: 01/26/2021 CLINICAL DATA:  Screening. EXAM: DIGITAL SCREENING BILATERAL MAMMOGRAM WITH TOMOSYNTHESIS AND CAD TECHNIQUE: Bilateral screening digital craniocaudal and mediolateral oblique mammograms were obtained. Bilateral screening digital breast tomosynthesis was performed. The images were evaluated with computer-aided detection. COMPARISON:  Previous exam(s). ACR Breast Density Category b: There are scattered areas of fibroglandular density. FINDINGS: There are no findings suspicious for malignancy. IMPRESSION: No mammographic evidence of malignancy. A result letter of this screening mammogram will be mailed directly to the patient. RECOMMENDATION: Screening mammogram in one year. (Code:SM-B-01Y) BI-RADS CATEGORY  1: Negative. Electronically Signed   By: Lajean Manes M.D.   On: 01/26/2021 13:39   MS DIGITAL SCREENING TOMO BILATERAL  Result Date: 04/20/2019 CLINICAL DATA:  Screening. EXAM: DIGITAL SCREENING BILATERAL MAMMOGRAM WITH TOMO AND CAD COMPARISON:  Previous exam(s). ACR Breast Density Category b: There are scattered areas of fibroglandular density. FINDINGS: There are no findings suspicious for malignancy. Images were processed with CAD. IMPRESSION: No mammographic evidence of malignancy. A result letter of this screening mammogram will be mailed directly to the patient. RECOMMENDATION: Screening mammogram in one year. (Code:SM-B-01Y) BI-RADS CATEGORY  1: Negative. Electronically Signed   By: Kristopher Oppenheim M.D.   On: 04/20/2019 15:55   MS DIGITAL  SCREENING TOMO BILATERAL  Result Date: 03/05/2018  CLINICAL DATA:  Screening. EXAM: DIGITAL SCREENING BILATERAL MAMMOGRAM WITH TOMO AND CAD COMPARISON:  Previous exam(s). ACR Breast Density Category b: There are scattered areas of fibroglandular density. FINDINGS: There are no findings suspicious for malignancy. Images were processed with CAD. IMPRESSION: No mammographic evidence of malignancy. A result letter of this screening mammogram will be mailed directly to the patient. RECOMMENDATION: Screening mammogram in one year. (Code:SM-B-01Y) BI-RADS CATEGORY  1: Negative. Electronically Signed   By: Franki Cabot M.D.   On: 03/05/2018 16:50      Pelvic/Bimanual Pap is not indicated today    Smoking History: Patient has never smoked and was not referred to quit line.    Patient Navigation: Patient education provided. Access to services provided for patient through [place] interpreter provided. No transportation provided   Colorectal Cancer Screening: Per patient has never had colonoscopy completed No complaints today. FIT test given.   Breast and Cervical Cancer Risk Assessment: Patient has family history of breast cancer, with her sister. Patient has history of cervical dysplasia, immunocompromised, or DES exposure in-utero.  Risk Assessment   No risk assessment data for the current encounter  Risk Scores       03/22/2021   Last edited by: Demetrius Revel, LPN   5-year risk: 1.6 %   Lifetime risk: 10 %            A: BCCCP exam without pap smear No complaints with benign exam.  P: Referred patient to the [facility] for a screening mammogram. Appointment scheduled 03/07/22.  Melodye Ped, NP 03/07/2022 12:44 PM

## 2022-04-05 LAB — GLUCOSE, POCT (MANUAL RESULT ENTRY): Glucose Fasting, POC: 109 mg/dL — AB (ref 70–99)

## 2022-04-05 NOTE — Progress Notes (Signed)
Pt is fasting today.

## 2022-05-07 ENCOUNTER — Ambulatory Visit: Payer: Self-pay | Attending: Internal Medicine | Admitting: Internal Medicine

## 2022-05-07 ENCOUNTER — Ambulatory Visit: Payer: Self-pay

## 2022-05-07 ENCOUNTER — Encounter: Payer: Self-pay | Admitting: Internal Medicine

## 2022-05-07 VITALS — BP 106/65 | HR 74 | Temp 97.7°F | Ht 63.0 in | Wt 173.0 lb

## 2022-05-07 DIAGNOSIS — M79672 Pain in left foot: Secondary | ICD-10-CM

## 2022-05-07 MED ORDER — IBUPROFEN 600 MG PO TABS: 600.0000 mg | ORAL_TABLET | Freq: Three times a day (TID) | ORAL | 0 refills | Status: AC | PRN

## 2022-05-07 NOTE — Telephone Encounter (Signed)
  Chief Complaint: Left foot pain and swelling Symptoms: above Frequency: 2 weeks Pertinent Negatives: Patient denies Fever, redness Disposition: [] ED /[] Urgent Care (no appt availability in office) / [x] Appointment(In office/virtual)/ []  [place] [facility]/ [] Home Care/ [] Refused Recommended Disposition /[] [facility]/ []  Follow-up with PCP Additional Notes: Pt knows of no injury to left foot. Left foot is painful 8/10. Foot swells sometimes, swelling resolves with elevation. Pt takes OTC pain medication with mild relief.   Summary: Left foot pain   Left Foot Pain for 15 days, no appt available until April 30th     Reason for Disposition  [1] MODERATE pain (e.g., interferes with normal activities, limping) AND [2] present > 3 days  Answer Assessment - Initial Assessment Questions 1. ONSET: "When did the pain start?"      15 days 2. LOCATION: "Where is the pain located?"      Left  3. PAIN: "How bad is the pain?"    (Scale 1-10; or mild, moderate, severe)  - MILD (1-3): doesn't interfere with normal activities.   - MODERATE (4-7): interferes with normal activities (e.g., work or school) or awakens from sleep, limping.   - SEVERE (8-10): excruciating pain, unable to do any normal activities, unable to walk.      8/10 4. WORK OR EXERCISE: "Has there been any recent work or exercise that involved this part of the body?"      none 5. CAUSE: "What do you think is causing the foot pain?"     unsure 6. OTHER SYMPTOMS: "Do you have any other symptoms?" (e.g., leg pain, rash, fever, numbness)     no  Protocols used: Foot Pain-A-AH

## 2022-05-07 NOTE — Progress Notes (Signed)
Patient ID: Diane King, female    DOB: [birth year]  MRN: [id number]  CC: Pain (Intermittent L foot pain x15 days/Yes to flu vax. )   Subjective: Diane King is a 57 y.o. female who presents for UC visit.  Son is with her.  Her concerns today include:  Pt with hx of preDM, cervical CA  [employer] Language interpreter used during this encounter. # Shona Simpson [phone]  Pt c/o pain in LT foot x 2 wks Pain dorsal mid foot. Some swelling at time it does swell  not much.  No prior episode.  Takes Ibuprofen at times but not consistently.  It does help No injury to the foot  Patient Active Problem List   Diagnosis Date Noted   Prediabetes 12/20/2020   Mass of upper inner quadrant of right breast 12/20/2020   Dermatitis 12/20/2020   BMI 29.0-29.9,adult 12/20/2020   ASCUS favor dysplasia 02/18/2014   Cervical cancer ([place]) 01/01/2011   Hx of radiation therapy      Current Outpatient Medications on File Prior to Visit  Medication Sig Dispense Refill   ibuprofen (ADVIL) 600 MG tablet Take 1 tablet (600 mg total) by mouth every 6 (six) hours as needed. 30 tablet 0   B Complex Vitamins (VITAMIN B-COMPLEX PO) Take by mouth. (Patient not taking: Reported on 05/07/2022)     No current facility-administered medications on file prior to visit.    No Known Allergies  Social History   Socioeconomic History   Marital status: Single    Spouse name: Not on file   Number of children: 3   Years of education: Not on file   Highest education level: 6th grade  Occupational History    Employer: UNEMPLOYED    Comment: widowed  Tobacco Use   Smoking status: Never   Smokeless tobacco: Never  Vaping Use   Vaping Use: Never used  Substance and Sexual Activity   Alcohol use: No   Drug use: Never   Sexual activity: Yes    Birth control/protection: Post-menopausal  Other Topics Concern   Not on file  Social History Narrative   Not on file   Social Determinants of Health   Financial  Resource Strain: Not on file  Food Insecurity: No Food Insecurity (04/05/2022)   Hunger Vital Sign    Worried About Running Out of Food in the Last Year: Never true    Ran Out of Food in the Last Year: Never true  Transportation Needs: No Transportation Needs (04/05/2022)   PRAPARE - Transportation    Lack of Transportation (Medical): No    Lack of Transportation (Non-Medical): No  Physical Activity: Not on file  Stress: Not on file  Social Connections: Not on file  Intimate Partner Violence: Not At Risk (04/05/2022)   Humiliation, Afraid, Rape, and Kick questionnaire    Fear of Current or Ex-Partner: No    Emotionally Abused: No    Physically Abused: No    Sexually Abused: No    Family History  Problem Relation Age of Onset   Breast cancer Sister    Head & neck cancer Mother    Diabetes Neg Hx    Hypertension Neg Hx     Past Surgical History:  Procedure Laterality Date   BILATERAL SALPINGOOPHORECTOMY  2011   Bilateral pelvic lymph node dissection    ROS: Review of Systems Negative except as stated above  PHYSICAL EXAM: BP 106/65 (BP Location: Left Arm, Patient Position: Sitting, Cuff Size: Normal)  Pulse 74   Temp 97.7 F (36.5 C) (Oral)   Ht 5\' 3"  (1.6 m)   Wt 173 lb (78.5 kg)   SpO2 98%   BMI 30.65 kg/m   Physical Exam  General appearance - alert, well appearing, and in no distress Mental status - normal mood, behavior, speech, dress, motor activity, and thought processes Musculoskeletal -left foot: No edema or erythema noted.  Mild tenderness on palpation over the midfoot/anterior ankle joint.  Mild discomfort with inversion and extension of the ankle joint.      Latest Ref Rng & Units 12/19/2020    9:48 AM 10/27/2019   10:01 AM 07/02/2013    9:31 AM  CMP  Glucose 70 - 99 mg/dL 99  CANCELED  110   BUN 6 - 24 mg/dL 11  CANCELED    Creatinine 0.57 - 1.00 mg/dL 0.60  CANCELED    Sodium 134 - 144 mmol/L 141  CANCELED    Potassium 3.5 - 5.2 mmol/L 4.8   CANCELED    Chloride 96 - 106 mmol/L 104  CANCELED    CO2   CANCELED    Calcium 8.7 - 10.2 mg/dL 9.3  CANCELED    Total Protein 6.0 - 8.5 g/dL 7.1  CANCELED    Total Bilirubin 0.0 - 1.2 mg/dL 0.3  CANCELED    Alkaline Phos 44 - 121 IU/L 98  CANCELED    AST 0 - 40 IU/L 14  CANCELED    ALT   CANCELED     Lipid Panel     Component Value Date/Time   CHOL 206 (H) 12/19/2020 0948   TRIG 202 (H) 12/19/2020 0948   HDL 42 12/19/2020 0948   CHOLHDL 4.9 (H) 12/19/2020 0948   CHOLHDL 5.4 07/02/2013 0934   VLDL 63 (H) 07/02/2013 0934   LDLCALC 128 (H) 12/19/2020 0948    CBC    Component Value Date/Time   WBC 4.4 12/19/2020 0948   WBC 3.0 (L) 06/21/2010 1424   WBC 2.4 (L) 12/07/2009 1150   RBC 4.72 12/19/2020 0948   RBC 3.89 06/21/2010 1424   RBC 3.58 (L) 12/07/2009 1150   HGB 12.0 12/19/2020 0948   HGB 11.5 (L) 06/21/2010 1424   HCT 38.3 12/19/2020 0948   HCT 34.5 (L) 06/21/2010 1424   PLT 301 12/19/2020 0948   MCV 81 12/19/2020 0948   MCV 88.6 06/21/2010 1424   MCH 25.4 (L) 12/19/2020 0948   MCH 29.5 06/21/2010 1424   MCH 29.6 12/08/2009 1006   MCHC 31.3 (L) 12/19/2020 0948   MCHC 33.3 06/21/2010 1424   MCHC 34.0 12/07/2009 1150   RDW 15.9 (H) 12/19/2020 0948   RDW 15.8 (H) 06/21/2010 1424   LYMPHSABS 1.5 12/19/2020 0948   LYMPHSABS 0.6 (L) 06/21/2010 1424   MONOABS 0.2 06/21/2010 1424   EOSABS 0.1 12/19/2020 0948   BASOSABS 0.0 12/19/2020 0948   BASOSABS 0.0 06/21/2010 1424    ASSESSMENT AND PLAN:  1. Foot pain, left Doubt any tendon or ligament injury.  Questionable arthritis.  Since she reports good results with ibuprofen, I recommend taking the ibuprofen 3 times a day with food for the next 5 to 7 days.  Also recommend using warm compresses to the dorsal midfoot.  If no improvement after a week, she will come in or call us back to let us know so that we can get x-rays and refer to podiatry.  She expressed understanding.  All questions were answered.  - ibuprofen  (ADVIL) 600 MG  tablet; Take 1 tablet (600 mg total) by mouth every 8 (eight) hours as needed (Take with food).  Dispense: 30 tablet; Refill: 0   Patient was given the opportunity to ask questions.  Patient verbalized understanding of the plan and was able to repeat key elements of the plan.   This documentation was completed using Radio producer.  Any transcriptional errors are unintentional.  No orders of the defined types were placed in this encounter.    Requested Prescriptions    No prescriptions requested or ordered in this encounter    No follow-ups on file.  Karle Plumber, MD, FACP

## 2022-05-24 ENCOUNTER — Ambulatory Visit: Payer: Self-pay

## 2022-05-24 NOTE — Telephone Encounter (Addendum)
   Used Spanish interpreter [employer] [phone].  Chief Complaint: Pain to both knees and shoulders. Motrin helps "some." Asking for medication or calcium, Vit D to be called in to her pharmacy. Declines OV at this time. Symptoms: Above Frequency: For "quite awhile." Pertinent Negatives: Patient denies any injury. Disposition: [] ED /[] Urgent Care (no appt availability in office) / [] Appointment(In office/virtual)/ []  [facility]/ [] Home Care/ [] Refused Recommended Disposition /[] [facility]/ [x]  Follow-up with PCP Additional Notes: Please advise pt.  Answer Assessment - Initial Assessment Questions 1. LOCATION and RADIATION: "Where is the pain located?"      Both knees 2. QUALITY: "What does the pain feel like?"  (e.g., sharp, dull, aching, burning)     Mild 3. SEVERITY: "How bad is the pain?" "What does it keep you from doing?"   (Scale 1-10; or mild, moderate, severe)   -  MILD (1-3): doesn't interfere with normal activities    -  MODERATE (4-7): interferes with normal activities (e.g., work or school) or awakens from sleep, limping    -  SEVERE (8-10): excruciating pain, unable to do any normal activities, unable to walk     Mild 4. ONSET: "When did the pain start?" "Does it come and go, or is it there all the time?"     For awhile 5. RECURRENT: "Have you had this pain before?" If Yes, ask: "When, and what happened then?"     Yes 6. SETTING: "Has there been any recent work, exercise or other activity that involved that part of the body?"      No 7. AGGRAVATING FACTORS: "What makes the knee pain worse?" (e.g., walking, climbing stairs, running)     Walking 8. ASSOCIATED SYMPTOMS: "Is there any swelling or redness of the knee?"     No 9. OTHER SYMPTOMS: "Do you have any other symptoms?" (e.g., chest pain, difficulty breathing, fever, calf pain)     No 10. PREGNANCY: "Is there any chance you are pregnant?" "When was your last menstrual period?"       No  Protocols  used: Knee Pain-A-AH

## 2022-05-27 ENCOUNTER — Telehealth: Payer: Self-pay

## 2022-05-27 NOTE — Telephone Encounter (Signed)
Copied from CRM [id number]. Topic: Referral - Request for Referral >> May 27, 2022  4:55 PM Marlow Baars wrote: Has patient seen PCP for this complaint? Yes.   Referral for which specialty: Podiatry Preferred provider/office: Any local podiatrist that accepts self pay patient  Reason for referral: Foot pain  The patient called in stating she continues to have pain in her bones of her feet even after using ibuprofen and compresses. Please assist further as she also made appt with her provider but first available not until next month. She is on a wait list

## 2022-05-28 NOTE — Telephone Encounter (Signed)
Advised of last OV note and plan by Dr. Laural Benes.  Pt to have referral to podiatry.

## 2022-05-29 NOTE — Telephone Encounter (Signed)
Unable to reach patient by phone to relay results.   

## 2022-05-30 NOTE — Telephone Encounter (Signed)
Return call unanswered. Voicemail left to return call. Interpreter Santos ID# [phone].

## 2022-07-04 ENCOUNTER — Ambulatory Visit: Payer: No Typology Code available for payment source | Admitting: Nurse Practitioner

## 2022-08-28 ENCOUNTER — Encounter: Payer: Self-pay | Admitting: Nurse Practitioner

## 2022-08-28 ENCOUNTER — Ambulatory Visit: Payer: Self-pay | Attending: Nurse Practitioner | Admitting: Nurse Practitioner

## 2022-08-28 VITALS — BP 114/78 | HR 72 | Ht 63.0 in | Wt 174.0 lb

## 2022-08-28 DIAGNOSIS — R7303 Prediabetes: Secondary | ICD-10-CM

## 2022-08-28 DIAGNOSIS — R7989 Other specified abnormal findings of blood chemistry: Secondary | ICD-10-CM

## 2022-08-28 DIAGNOSIS — E559 Vitamin D deficiency, unspecified: Secondary | ICD-10-CM

## 2022-08-28 DIAGNOSIS — E782 Mixed hyperlipidemia: Secondary | ICD-10-CM

## 2022-08-28 NOTE — Progress Notes (Signed)
Assessment & Plan:  Diane King was seen today for foot pain and knee pain.  Diagnoses and all orders for this visit:  Abnormal CBC -     CBC with Differential  Elevated TSH -     Thyroid Panel With TSH  Vitamin D deficiency disease -     VITAMIN D 25 Hydroxy (Vit-D Deficiency, Fractures)  Prediabetes -     Hemoglobin A1c -     CMP14+EGFR  Mixed hyperlipidemia -     Lipid panel    Patient has been counseled on age-appropriate routine health concerns for screening and prevention. These are reviewed and up-to-date. Referrals have been placed accordingly. Immunizations are up-to-date or declined.    Subjective:   Chief Complaint  Patient presents with   Foot Pain   Knee Pain    Diane King 57 y.o. female presents to office today for blood work.    She is accompanied by her son today.  I have not seen her in 2 years.  VRI was used to communicate directly with patient for the entire encounter including providing detailed patient instructions.      She  initially stated to the CMA that she was experiencing left foot pain and left knee pain. However she tells me that she does not have any foot or joint pain as long as she takes a medication she was given in [place] called neuruobion which is essentially Vitamin B12 here in the states.  I have instructed her to continue this as needed if it helps with her pain.     Review of Systems  Constitutional:  Negative for fever, malaise/fatigue and weight loss.  HENT: Negative.  Negative for nosebleeds.   Eyes: Negative.  Negative for blurred vision, double vision and photophobia.  Respiratory: Negative.  Negative for cough and shortness of breath.   Cardiovascular: Negative.  Negative for chest pain, palpitations and leg swelling.  Gastrointestinal: Negative.  Negative for heartburn, nausea and vomiting.  Musculoskeletal:  Positive for joint pain. Negative for myalgias.  Neurological: Negative.  Negative for dizziness, focal  weakness, seizures and headaches.  Psychiatric/Behavioral: Negative.  Negative for suicidal ideas.     Past Medical History:  Diagnosis Date   Cervical cancer ([facility]) 07-27-2009 CLINICAL IB1   POORLY DIFFERENTIATED SQUAMOUS CELL CARCINOMA   History of chemotherapy 11/15/09   Cisplatin 4 cycles weekly completed 11/15/09   Hx of radiation therapy 10/10/09-11/14/09   cervical    Hx of radiation therapy 12/14/09-12/17/09   cesium implant intracavity    Past Surgical History:  Procedure Laterality Date   BILATERAL SALPINGOOPHORECTOMY  2011   Bilateral pelvic lymph node dissection    Family History  Problem Relation Age of Onset   Breast cancer Sister    Head & neck cancer Mother    Diabetes Neg Hx    Hypertension Neg Hx     Social History Reviewed with no changes to be made today.   Outpatient Medications Prior to Visit  Medication Sig Dispense Refill   B Complex Vitamins (VITAMIN B-COMPLEX PO) Take by mouth.     ibuprofen (ADVIL) 600 MG tablet Take 1 tablet (600 mg total) by mouth every 8 (eight) hours as needed (Take with food). (Patient not taking: Reported on 08/28/2022) 30 tablet 0   No facility-administered medications prior to visit.    No Known Allergies     Objective:    BP 114/78 (BP Location: Left Arm, Patient Position: Sitting, Cuff Size: Normal)   Pulse  72   Ht 5\' 3"  (1.6 m)   Wt 174 lb (78.9 kg)   SpO2 97%   BMI 30.82 kg/m  Wt Readings from Last 3 Encounters:  08/28/22 174 lb (78.9 kg)  05/07/22 173 lb (78.5 kg)  03/07/22 175 lb (79.4 kg)    Physical Exam Vitals and nursing note reviewed.  Constitutional:      Appearance: She is well-developed.  HENT:     Head: Normocephalic and atraumatic.  Cardiovascular:     Rate and Rhythm: Normal rate and regular rhythm.     Heart sounds: Normal heart sounds. No murmur heard.    No friction rub. No gallop.  Pulmonary:     Effort: Pulmonary effort is normal. No tachypnea or respiratory distress.     Breath  sounds: Normal breath sounds. No decreased breath sounds, wheezing, rhonchi or rales.  Chest:     Chest wall: No tenderness.  Abdominal:     General: Bowel sounds are normal.     Palpations: Abdomen is soft.  Musculoskeletal:        General: Normal range of motion.     Cervical back: Normal range of motion.  Skin:    General: Skin is warm and dry.  Neurological:     Mental Status: She is alert and oriented to person, place, and time.     Coordination: Coordination normal.  Psychiatric:        Behavior: Behavior normal. Behavior is cooperative.        Thought Content: Thought content normal.        Judgment: Judgment normal.          Patient has been counseled extensively about nutrition and exercise as well as the importance of adherence with medications and regular follow-up. The patient was given clear instructions to go to ER or return to medical center if symptoms don't improve, worsen or new problems develop. The patient verbalized understanding.   Follow-up: Return in about 3 months (around 11/28/2022) for PAP SMEAR.   Claiborne Rigg, FNP-BC [facility] [place], [place] [phone]   08/28/2022, 4:45 PM

## 2022-11-29 ENCOUNTER — Ambulatory Visit: Payer: Self-pay | Admitting: Nurse Practitioner

## 2022-12-11 ENCOUNTER — Ambulatory Visit: Payer: Self-pay | Admitting: Nurse Practitioner

## 2023-03-19 ENCOUNTER — Encounter: Payer: Self-pay | Admitting: Nurse Practitioner

## 2023-04-11 IMAGING — US US BREAST*R* LIMITED INC AXILLA
1 series · 6 of 6 positions shown · non-contrast
Comparison: Previous exams.

CLINICAL DATA: 55-year-old female with a palpable abnormality
involving the skin of her superior right breast which has been
present for several months. The patient did receive a course of
antibiotics in this is decreasing in size.

EXAM:
DIGITAL DIAGNOSTIC UNILATERAL RIGHT MAMMOGRAM WITH TOMOSYNTHESIS AND
CAD; ULTRASOUND RIGHT BREAST LIMITED
TECHNIQUE: Right digital diagnostic mammography and breast tomosynthesis was
performed. The images were evaluated with computer-aided detection.;
Targeted ultrasound examination of the right breast was performed

[Series 1: us breast*right* limited inc axilla · 0.05mm/px · 6 of 6 slices shown]
[im 1/6]
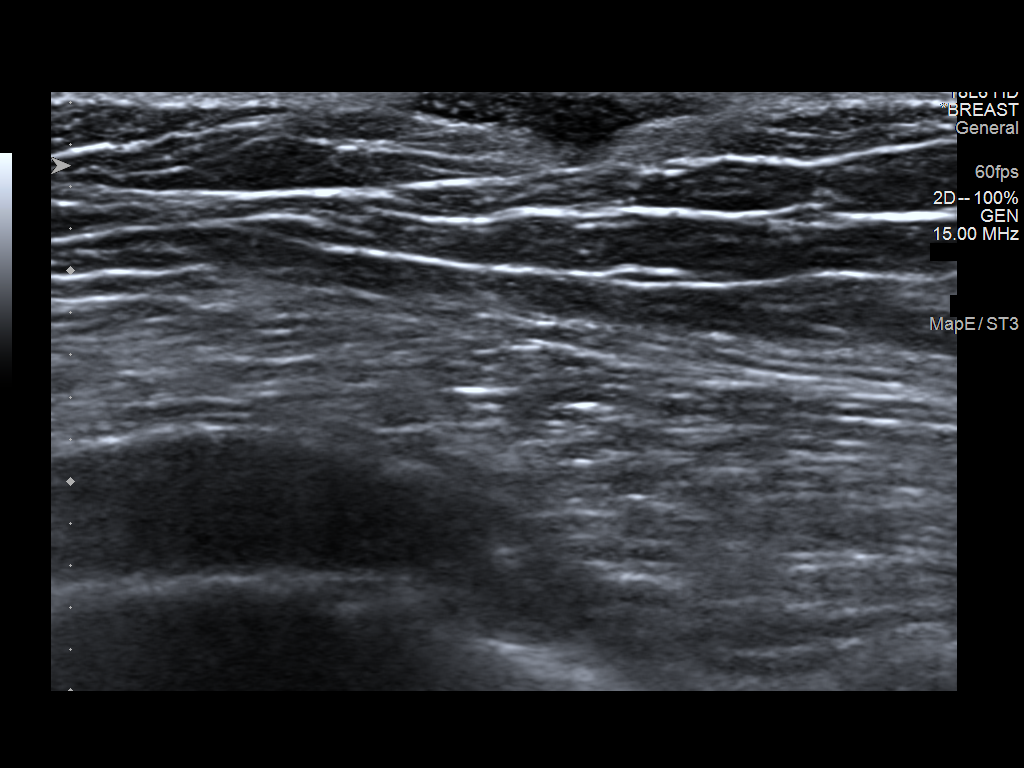
[im 2/6]
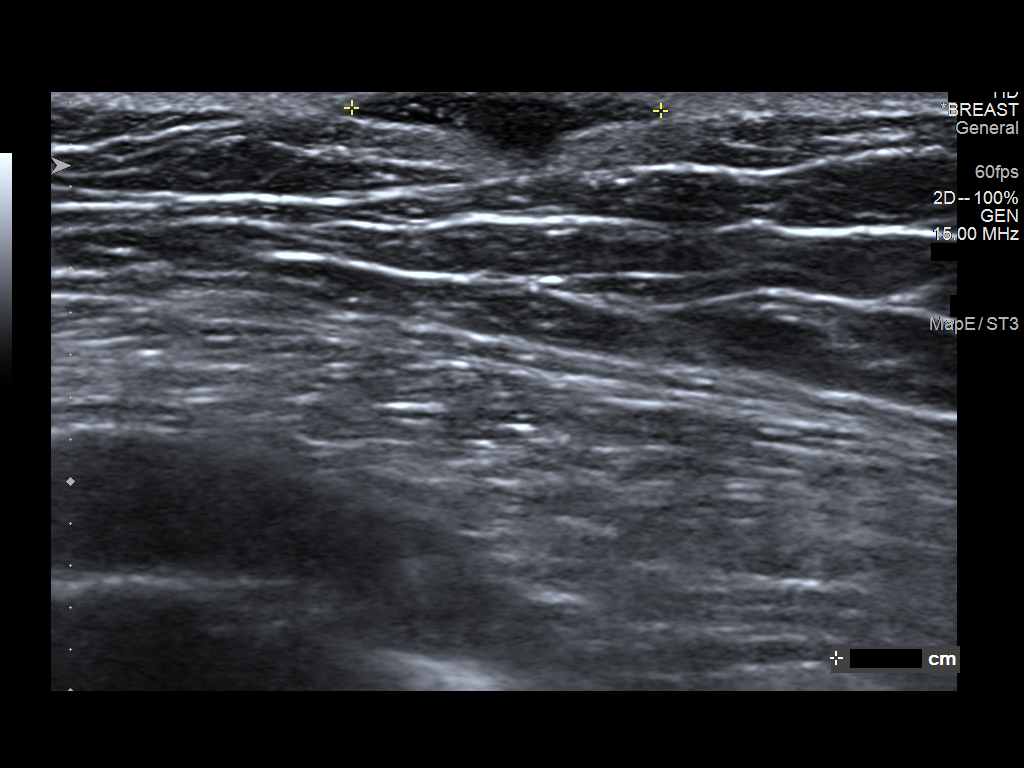
[im 3/6]
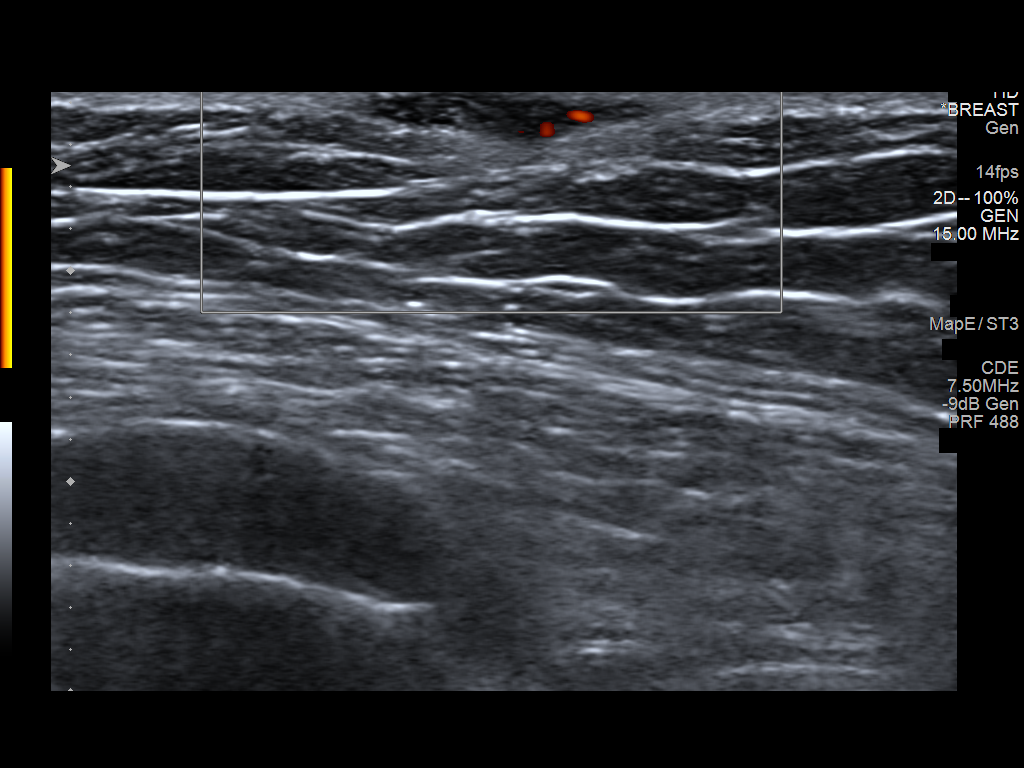
[im 4/6]
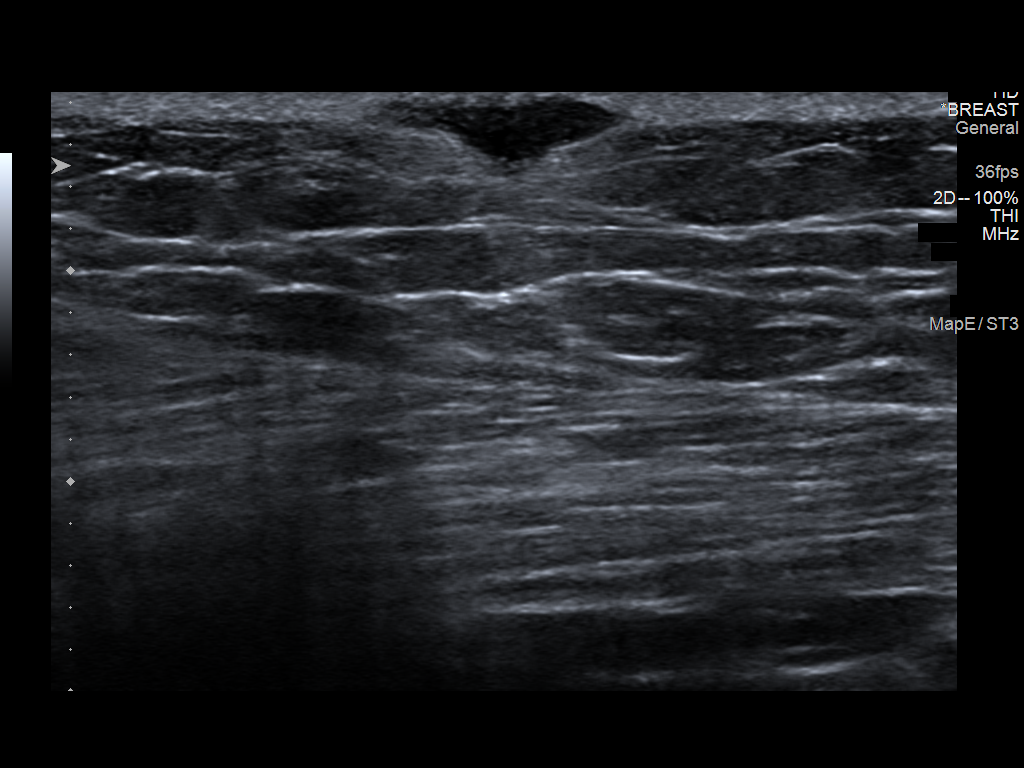
[im 5/6]
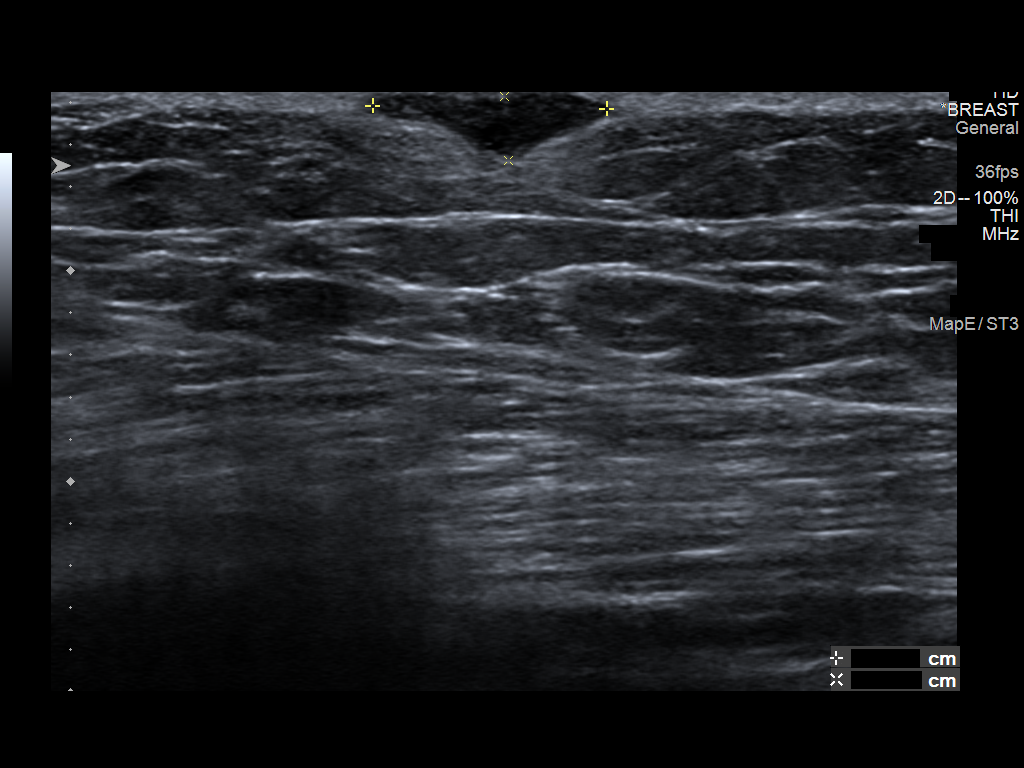
[im 6/6]
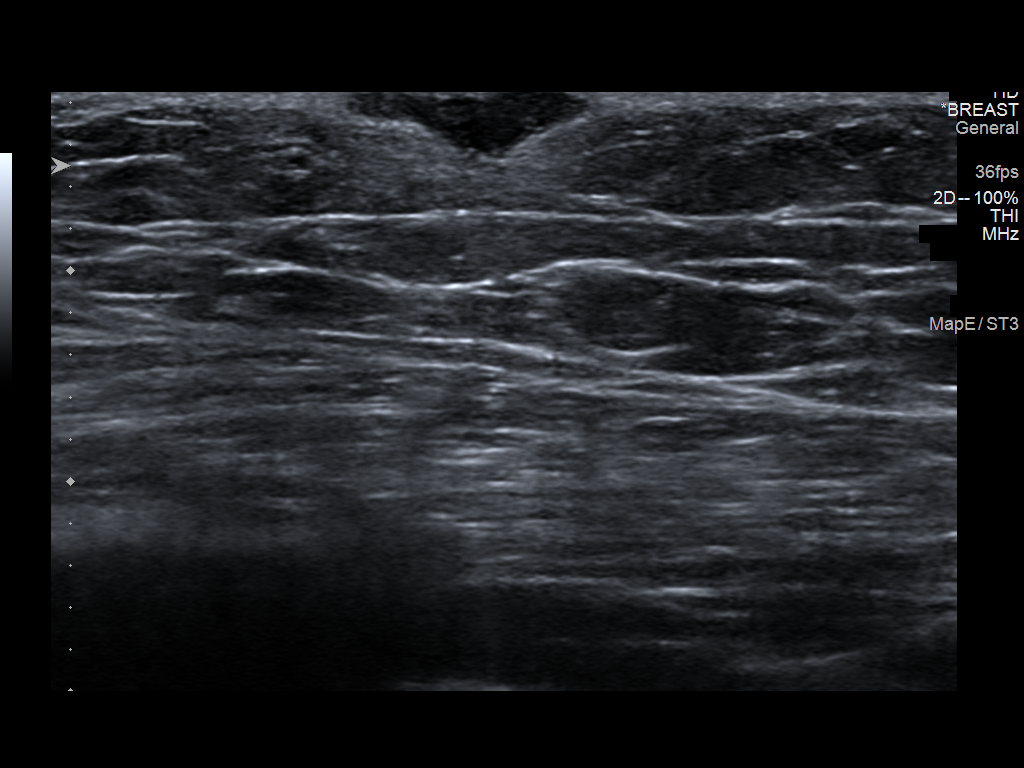

[6 of 6 positions shown; findings below may reference images not displayed]

ACR Breast Density Category c: The breast tissue is heterogeneously
dense, which may obscure small masses.
FINDINGS: Spot compression tomograms were performed over the palpable area of
concern in the upper right breast demonstrating an ill-defined
somewhat oval asymmetry.

Physical examination reveals focal thickening of the skin at the
approximate 1 to 2 o'clock position with overlying brownish skin
discoloration and a central black pore.

Targeted ultrasound of the right breast was performed demonstrating
a small near anechoic mass in the skin of the right breast at 2
o'clock 10 cm from nipple measuring 1.5 x 0.3 x 1.1 cm. Findings are
most consistent with a sebaceous cyst.
IMPRESSION: Sebaceous cyst at site of palpable concern in the right breast.

RECOMMENDATION:
1.  Clinical follow-up for right breast sebaceous cyst.

2.  Recommend annual screening mammography, due January 2022.

I have discussed the findings and recommendations with the patient.
If applicable, a reminder letter will be sent to the patient
regarding the next appointment.

BI-RADS CATEGORY  2: Benign.

## 2023-04-11 IMAGING — MG MM DIGITAL DIAGNOSTIC UNILAT*R* W/ TOMO W/ CAD
2 series · 3 of 6 positions shown · non-contrast
Comparison: Previous exams.

CLINICAL DATA: 55-year-old female with a palpable abnormality
involving the skin of her superior right breast which has been
present for several months. The patient did receive a course of
antibiotics in this is decreasing in size.

EXAM:
DIGITAL DIAGNOSTIC UNILATERAL RIGHT MAMMOGRAM WITH TOMOSYNTHESIS AND
CAD; ULTRASOUND RIGHT BREAST LIMITED
TECHNIQUE: Right digital diagnostic mammography and breast tomosynthesis was
performed. The images were evaluated with computer-aided detection.;
Targeted ultrasound examination of the right breast was performed

[R TAN synth-2D]
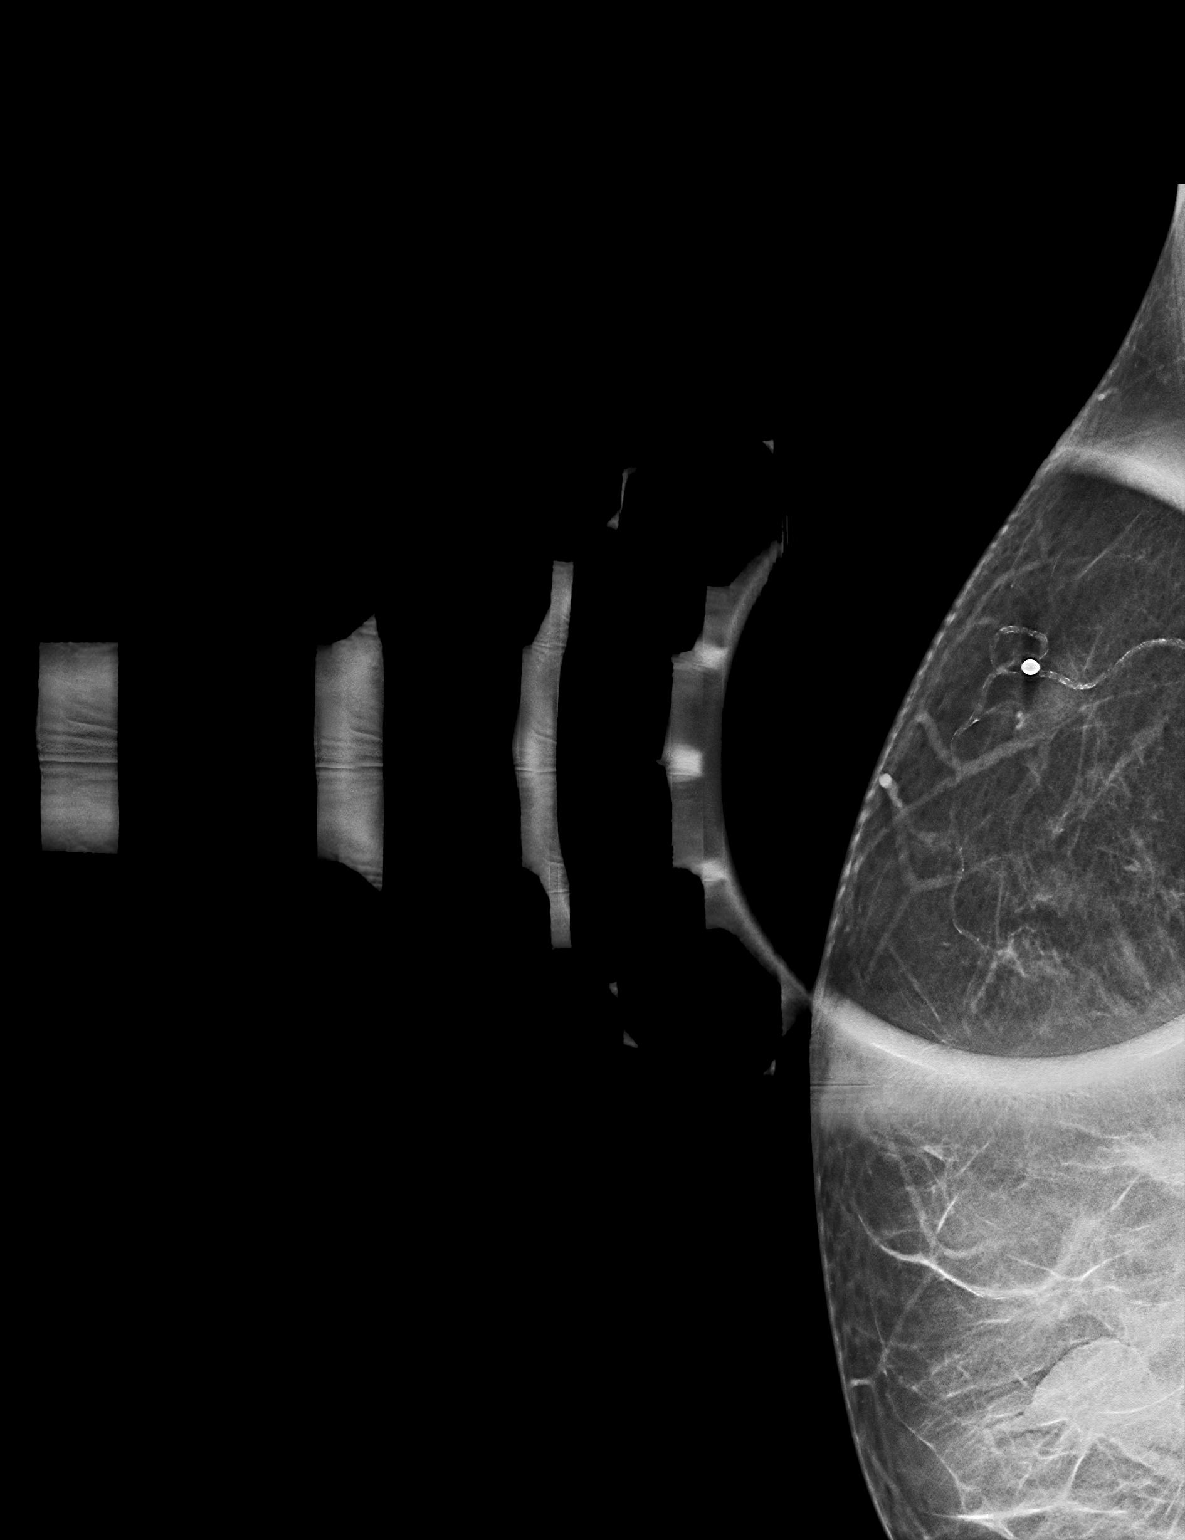

[R TAN tomo · 2 of 43 frames shown]
[frame 14/43]
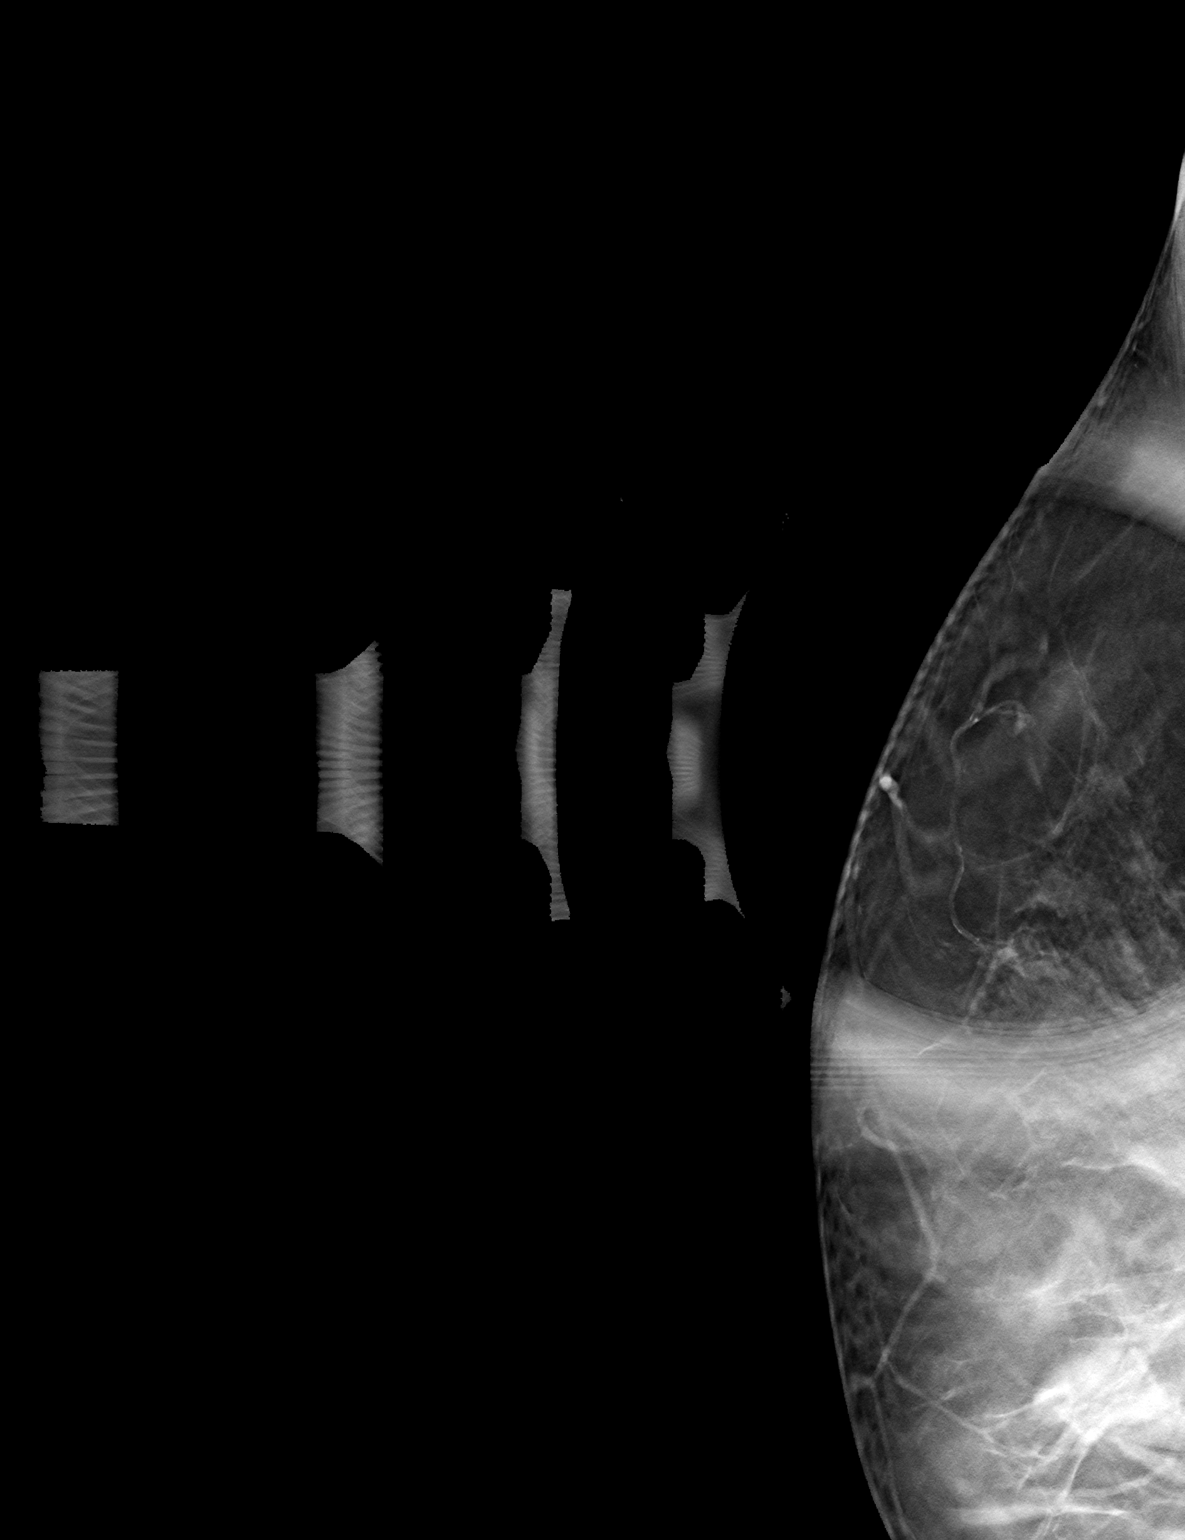
[frame 22/43]
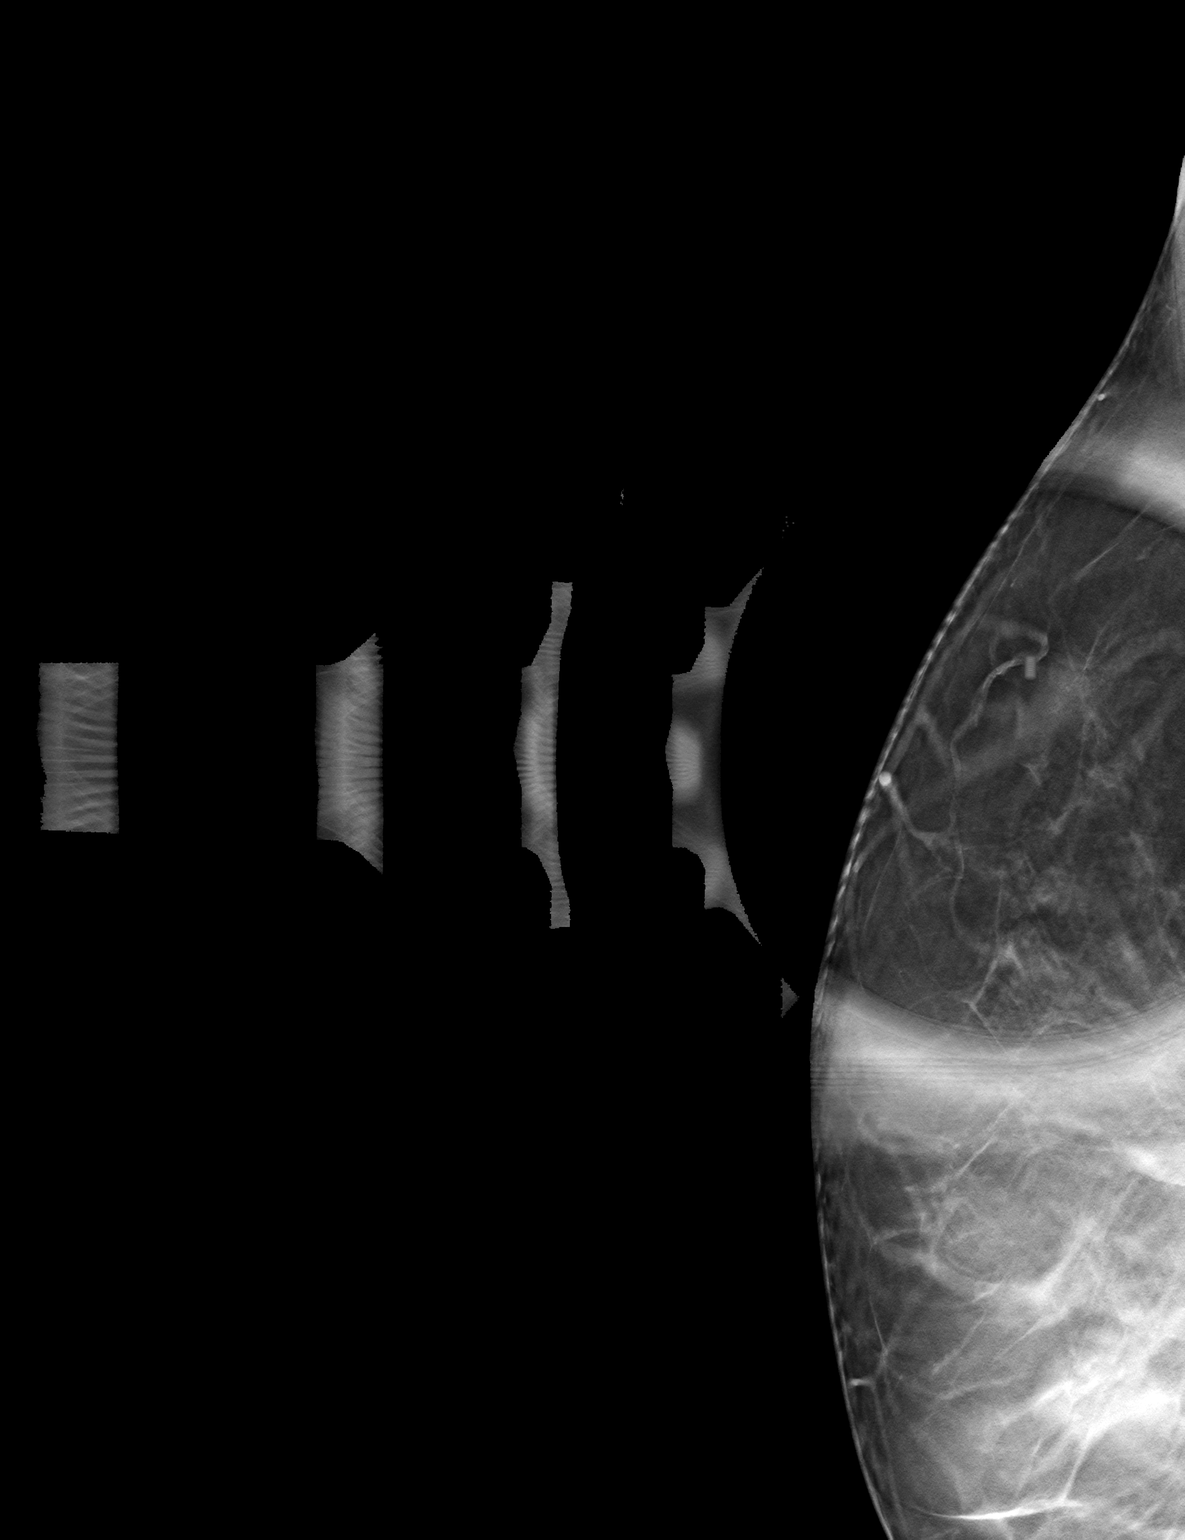

[3 of 6 positions shown; findings below may reference images not displayed]

ACR Breast Density Category c: The breast tissue is heterogeneously
dense, which may obscure small masses.
FINDINGS: Spot compression tomograms were performed over the palpable area of
concern in the upper right breast demonstrating an ill-defined
somewhat oval asymmetry.

Physical examination reveals focal thickening of the skin at the
approximate 1 to 2 o'clock position with overlying brownish skin
discoloration and a central black pore.

Targeted ultrasound of the right breast was performed demonstrating
a small near anechoic mass in the skin of the right breast at 2
o'clock 10 cm from nipple measuring 1.5 x 0.3 x 1.1 cm. Findings are
most consistent with a sebaceous cyst.
IMPRESSION: Sebaceous cyst at site of palpable concern in the right breast.

RECOMMENDATION:
1.  Clinical follow-up for right breast sebaceous cyst.

2.  Recommend annual screening mammography, due January 2022.

I have discussed the findings and recommendations with the patient.
If applicable, a reminder letter will be sent to the patient
regarding the next appointment.

BI-RADS CATEGORY  2: Benign.

## 2023-04-22 ENCOUNTER — Encounter: Payer: Self-pay | Admitting: Nurse Practitioner

## 2023-04-29 ENCOUNTER — Telehealth: Payer: Self-pay

## 2023-04-29 NOTE — Telephone Encounter (Signed)
 Patient called requested spanish interpreter services to assist her with call. [employer] Boom # [phone], returned called, left message on voicemail, attempted conference call from my line, not able to reach patient, received voicemail.

## 2023-05-01 ENCOUNTER — Telehealth: Payer: Self-pay

## 2023-05-01 NOTE — Telephone Encounter (Signed)
 Telephoned patient using interpreter, Delorise Royals. Left a voice message with B[facility]CP contact information.

## 2023-05-19 ENCOUNTER — Ambulatory Visit: Payer: Self-pay | Attending: Nurse Practitioner | Admitting: Nurse Practitioner

## 2023-10-01 ENCOUNTER — Other Ambulatory Visit: Payer: Self-pay | Admitting: Obstetrics and Gynecology

## 2023-10-01 DIAGNOSIS — Z1231 Encounter for screening mammogram for malignant neoplasm of breast: Secondary | ICD-10-CM

## 2023-12-02 ENCOUNTER — Ambulatory Visit
Admission: RE | Admit: 2023-12-02 | Discharge: 2023-12-02 | Disposition: A | Discharge status: Home / Self Care | Payer: Self-pay | Source: Ambulatory Visit | Attending: Obstetrics and Gynecology | Admitting: Obstetrics and Gynecology

## 2023-12-02 ENCOUNTER — Ambulatory Visit: Payer: Self-pay

## 2023-12-02 VITALS — BP 119/80 | Wt 167.0 lb

## 2023-12-02 DIAGNOSIS — Z1239 Encounter for other screening for malignant neoplasm of breast: Secondary | ICD-10-CM

## 2023-12-02 DIAGNOSIS — Z1231 Encounter for screening mammogram for malignant neoplasm of breast: Secondary | ICD-10-CM

## 2023-12-02 DIAGNOSIS — Z1211 Encounter for screening for malignant neoplasm of colon: Secondary | ICD-10-CM

## 2023-12-02 NOTE — Progress Notes (Unsigned)
 [name] is a 58 y.o. female who presents to [facility] clinic today with no complaints.    Pap Smear: Pap smear not completed today. Last Pap smear was 06/04/2019 at the [facility] clinic and was normal with negative HPV. Patient has a history of and abnormal Pap smear 12/21/2012 that was ASCUS with positive HPV showing epithelial Atypia and 06/17/2011 that showed atypical cells highly suspicious for malignancy. Patient had a follow up colposcopy 06/27/2011, 07/17/2013, and 02/18/2014. Patient is followed by GYN Oncology at the [facility] for treatment. The above Pap smear results and colposcopies are available in EPIC.  Physical exam: Breasts Breasts symmetrical. No skin abnormalities bilateral breasts. No nipple retraction right breast. Left nipple inverted that per patient is normal for her. Documented in previous B[facility]CP assessment 03/05/2018. No nipple discharge bilateral breasts. No lymphadenopathy. No lumps palpated bilateral breasts. No complaints of pain or tenderness on exam.     MS DIGITAL SCREENING TOMO BILATERAL Result Date: 03/11/2022 CLINICAL DATA:  Screening. EXAM: DIGITAL SCREENING BILATERAL MAMMOGRAM WITH TOMOSYNTHESIS AND CAD TECHNIQUE: Bilateral screening digital craniocaudal and mediolateral oblique mammograms were obtained. Bilateral screening digital breast tomosynthesis was performed. The images were evaluated with computer-aided detection. COMPARISON:  Previous exam(s). ACR Breast Density Category b: There are scattered areas of fibroglandular density. FINDINGS: There are no findings suspicious for malignancy. IMPRESSION: No mammographic evidence of malignancy. A result letter of this screening mammogram will be mailed directly to the patient. RECOMMENDATION: Screening mammogram in one year. (Code:SM-B-01Y) BI-RADS CATEGORY  1: Negative. Electronically Signed   By: Lael Hines M.D.   On: 03/11/2022 16:22   MS DIGITAL DIAG TOMO UNI  RIGHT Result Date: 03/29/2021 CLINICAL DATA:  58 year old female with a palpable abnormality involving the skin of her superior right breast which has been present for several months. The patient did receive a course of antibiotics in this is decreasing in size. EXAM: DIGITAL DIAGNOSTIC UNILATERAL RIGHT MAMMOGRAM WITH TOMOSYNTHESIS AND CAD; ULTRASOUND RIGHT BREAST LIMITED TECHNIQUE: Right digital diagnostic mammography and breast tomosynthesis was performed. The images were evaluated with computer-aided detection.; Targeted ultrasound examination of the right breast was performed COMPARISON:  Previous exams. ACR Breast Density Category c: The breast tissue is heterogeneously dense, which may obscure small masses. FINDINGS: Spot compression tomograms were performed over the palpable area of concern in the upper right breast demonstrating an ill-defined somewhat oval asymmetry. Physical examination reveals focal thickening of the skin at the approximate 1 to 2 o'clock position with overlying brownish skin discoloration and a central black pore. Targeted ultrasound of the right breast was performed demonstrating a small near anechoic mass in the skin of the right breast at 2 o'clock 10 cm from nipple measuring 1.5 x 0.3 x 1.1 cm. Findings are most consistent with a sebaceous cyst. IMPRESSION: Sebaceous cyst at site of palpable concern in the right breast. RECOMMENDATION: 1.  Clinical follow-up for right breast sebaceous cyst. 2.  Recommend annual screening mammography, due December 2023. I have discussed the findings and recommendations with the patient. If applicable, a reminder letter will be sent to the patient regarding the next appointment. BI-RADS CATEGORY  2: Benign. Electronically Signed   By: Delon Music M.D.   On: 03/29/2021 10:43  MM 3D SCREEN BREAST BILATERAL Result Date: 01/26/2021 CLINICAL DATA:  Screening. EXAM: DIGITAL SCREENING BILATERAL MAMMOGRAM WITH TOMOSYNTHESIS AND CAD TECHNIQUE:  Bilateral screening digital craniocaudal and mediolateral oblique mammograms were obtained. Bilateral screening digital breast tomosynthesis  was performed. The images were evaluated with computer-aided detection. COMPARISON:  Previous exam(s). ACR Breast Density Category b: There are scattered areas of fibroglandular density. FINDINGS: There are no findings suspicious for malignancy. IMPRESSION: No mammographic evidence of malignancy. A result letter of this screening mammogram will be mailed directly to the patient. RECOMMENDATION: Screening mammogram in one year. (Code:SM-B-01Y) BI-RADS CATEGORY  1: Negative. Electronically Signed   By: Alm Parkins M.D.   On: 01/26/2021 13:39   MS DIGITAL SCREENING TOMO BILATERAL Result Date: 04/20/2019 CLINICAL DATA:  Screening. EXAM: DIGITAL SCREENING BILATERAL MAMMOGRAM WITH TOMO AND CAD COMPARISON:  Previous exam(s). ACR Breast Density Category b: There are scattered areas of fibroglandular density. FINDINGS: There are no findings suspicious for malignancy. Images were processed with CAD. IMPRESSION: No mammographic evidence of malignancy. A result letter of this screening mammogram will be mailed directly to the patient. RECOMMENDATION: Screening mammogram in one year. (Code:SM-B-01Y) BI-RADS CATEGORY  1: Negative. Electronically Signed   By: Serena  Chacko M.D.   On: 04/20/2019 15:55    Pelvic/Bimanual Pap is not indicated today per BCCCP guidelines.   Smoking History: Patient has never smoked.   Patient Navigation: Patient education provided. Access to services provided for patient through [employer] program. Spanish interpreter Bernice Angry from [facility] provided.   Colorectal Cancer Screening: Per patient has never had colonoscopy completed. FIT Test given to patient to complete. No complaints today.    Breast and Cervical Cancer Risk Assessment: Patient has a family history of her sister having breast cancer. Patient has no known genetic mutations or  history of radiation treatment to the chest before age 21. Patient has a history of cervical cancer. Patient has no history of being immunocompromised or DES exposure in-utero.    Risk Scores as of Encounter on 12/02/2023     Alisa           5-year 2.67%   Lifetime 13.23%   This patient is [place]/Latina but has no documented birth country, so the [place] model used data from [place] patients to calculate their risk score. Document a birth country in the Demographics activity for a more accurate score.         Last calculated by Silas, Ansyi K, CMA on 12/02/2023 at 11:00 AM        A: B[facility]CP exam without pap smear No complaints.  P: Referred patient to the [facility] for a screening mammogram on mobile unit. Appointment scheduled Tuesday, December 01, 2023 at 1130.  Driscilla Wanda SQUIBB, RN 12/02/2023 11:04 AM

## 2023-12-02 NOTE — Patient Instructions (Signed)
 Taught Parminder Trapani how to perform BSE and gave educational materials to take home. Patient did not need a Pap smear today due to last Pap smear was in *** per patient. Told patient about free cervical cancer screenings to receive a Pap smear if would like one next year. Let her know B[facility]CP will cover Pap smears every 3 years unless has a history of abnormal Pap smears. Referred patient to the [facility] for diagnostic mammogram. Appointment scheduled for ***, 2014 at ***. Patient aware of appointment and will be there. Let patient know will follow up with her within the next couple weeks with results. Satoya Feeley verbalized understanding.  [place] Ndiaye, Wanda Ship, RN @T @ 11:04 AM

## 2023-12-15 ENCOUNTER — Other Ambulatory Visit: Payer: Self-pay | Admitting: Obstetrics and Gynecology

## 2023-12-15 DIAGNOSIS — R928 Other abnormal and inconclusive findings on diagnostic imaging of breast: Secondary | ICD-10-CM

## 2024-02-23 ENCOUNTER — Ambulatory Visit
Admission: RE | Admit: 2024-02-23 | Discharge: 2024-02-23 | Disposition: A | Source: Ambulatory Visit | Attending: Obstetrics and Gynecology | Admitting: Obstetrics and Gynecology

## 2024-02-23 DIAGNOSIS — R928 Other abnormal and inconclusive findings on diagnostic imaging of breast: Secondary | ICD-10-CM
